# Patient Record
Sex: Female | Born: 1985 | Hispanic: Yes | Marital: Single | Smoking: Never smoker
Health system: Southern US, Community
[De-identification: ages and names within clinical notes are randomized; demographics above are authoritative.]

## PROBLEM LIST (undated history)

## (undated) DIAGNOSIS — Z98891 History of uterine scar from previous surgery: Secondary | ICD-10-CM

## (undated) DIAGNOSIS — Z3480 Encounter for supervision of other normal pregnancy, unspecified trimester: Principal | ICD-10-CM

## (undated) DIAGNOSIS — Z789 Other specified health status: Secondary | ICD-10-CM

## (undated) HISTORY — DX: History of uterine scar from previous surgery: Z98.891

## (undated) HISTORY — DX: Other specified health status: Z78.9

## (undated) HISTORY — DX: Encounter for supervision of other normal pregnancy, unspecified trimester: Z34.80

---

## 2005-08-19 DIAGNOSIS — Z98891 History of uterine scar from previous surgery: Secondary | ICD-10-CM

## 2005-08-19 HISTORY — DX: History of uterine scar from previous surgery: Z98.891

## 2012-10-15 ENCOUNTER — Encounter: Payer: Self-pay | Admitting: Obstetrics and Gynecology

## 2012-10-15 ENCOUNTER — Ambulatory Visit (INDEPENDENT_AMBULATORY_CARE_PROVIDER_SITE_OTHER): Payer: Self-pay | Admitting: Obstetrics and Gynecology

## 2012-10-15 VITALS — BP 111/74 | Temp 98.0°F | Ht 60.75 in | Wt 181.6 lb

## 2012-10-15 DIAGNOSIS — O34219 Maternal care for unspecified type scar from previous cesarean delivery: Secondary | ICD-10-CM

## 2012-10-15 DIAGNOSIS — Z3480 Encounter for supervision of other normal pregnancy, unspecified trimester: Secondary | ICD-10-CM | POA: Insufficient documentation

## 2012-10-15 DIAGNOSIS — Z98891 History of uterine scar from previous surgery: Secondary | ICD-10-CM

## 2012-10-15 HISTORY — DX: Encounter for supervision of other normal pregnancy, unspecified trimester: Z34.80

## 2012-10-15 LAB — POCT URINALYSIS DIP (DEVICE)
Leukocytes, UA: NEGATIVE
Nitrite: NEGATIVE
Protein, ur: NEGATIVE mg/dL
Urobilinogen, UA: 0.2 mg/dL (ref 0.0–1.0)
pH: 7 (ref 5.0–8.0)

## 2012-10-15 NOTE — Progress Notes (Signed)
Transferring care from [facility]  For adopt a mom.  Used Interpreter Daylene Katayama. Patient not sure of prepregnancy weight but thinks was 170lb, discussed appropriate weight gain and given new patient information. States received flu shot at [facility]. Did not get any blood work done there.

## 2012-10-15 NOTE — Progress Notes (Signed)
Subjective:    Kimberly Pope is a G3P2003 [redacted]w[redacted]d being seen today for her first obstetrical visit.  Her obstetrical history is significant for one SVD at 40 weeks and one cesarean delivery of twins at 14 weeks (all delviered at [facility]). Unsure if patient intends to breast feed. Pregnancy history fully reviewed.  Patient reports no complaints and and good fetal movements.  Filed Vitals:   10/15/12 0925 10/15/12 0931  BP: 111/74   Temp: 98 F (36.7 C)   Height:  5' 0.75" (1.543 m)  Weight: 181 lb 9.6 oz (82.373 kg)     HISTORY: OB History    Grav Para Term Preterm Abortions TAB SAB Ect Mult Living   3 2 2      1 3      # Outc Date GA Lbr Len/2nd Wgt Sex Del Anes PTL Lv   1 TRM 11/04 [redacted]w[redacted]d 16:00 7lb(3.175kg) F SVD EPI  Yes   Comments: no complications, born at [place]   2A TRM 9/06 [redacted]w[redacted]d  5lb(2.268kg) F CS EPI  Yes   2B  9/06 [redacted]w[redacted]d  6lb(2.722kg) F CS EPI  Yes   Comments: twins, born at [place] , no complications   3 CUR              Past Medical History  Diagnosis Date  . No pertinent past medical history   . Supervision of normal subsequent pregnancy 10/15/2012  . History of cesarean delivery 10/15/2012   Past Surgical History  Procedure Date  . Cesarean section    Family History  Problem Relation Age of Onset  . Depression Mother   . Diabetes Mother   . Hypertension Mother   . Heart murmur Daughter      Exam    Uterus:     Pelvic Exam:    Perineum: No Hemorrhoids, Normal Perineum   Vulva: normal   Vagina:  normal mucosa   Cervix: multiparous appearance and no bleeding following GC/CT   Adnexa: normal adnexa and no mass, fullness, tenderness   Bony Pelvis: gynecoid  System: Breast:  normal appearance for gestation, no masses or tenderness   Skin: normal coloration and turgor, no rashes    Neurologic: oriented, normal mood, normal gait   Extremities: normal strength, tone, and muscle mass, no deformities   HEENT extra ocular movement intact,  neck supple with midline trachea, thyroid without masses and trachea midline   Mouth/Teeth mucous membranes moist, pharynx normal without lesions and dental hygiene good   Neck supple and no masses   Cardiovascular: regular rate and rhythm, no murmurs or gallops   Respiratory:  appears well, vitals normal, no respiratory distress, acyanotic, normal RR, ear and throat exam is normal, neck free of mass or lymphadenopathy, chest clear, no wheezing, crepitations, rhonchi, normal symmetric air entry   Abdomen: soft, non-tender; bowel sounds normal; no masses,  no organomegaly and gravid, S=D by Leopold's   Urinary: not assessed      Assessment:    Pregnancy: G3P2003 Patient Active Problem List  Diagnosis  . Supervision of normal subsequent pregnancy  . History of cesarean delivery  Transfer OB - Adopt-A-Mom      Plan:     Initial labs drawn. Prenatal vitamins. Problem list reviewed and updated. Risks and benefits of TOLAC reviewed with patient. TOLAC/VBAC consent form given (spanish). Genetic Screening: not done - advanced gestation. Ultrasound discussed; fetal survey: results reviewed. Follow up in 2 weeks. 50% of 45 min visit spent on counseling and coordination  of care.     Raelyn Mora, SNM 10/15/2012 Supervised by: Caren Griffins, CNM

## 2012-10-16 ENCOUNTER — Telehealth: Payer: Self-pay | Admitting: *Deleted

## 2012-10-16 LAB — OBSTETRIC PANEL
Antibody Screen: NEGATIVE
Basophils Absolute: 0 10*3/uL (ref 0.0–0.1)
Basophils Relative: 0 % (ref 0–1)
Lymphocytes Relative: 31 % (ref 12–46)
MCHC: 33.4 g/dL (ref 30.0–36.0)
Monocytes Absolute: 0.4 10*3/uL (ref 0.1–1.0)
Neutro Abs: 4.6 10*3/uL (ref 1.7–7.7)
Platelets: 234 10*3/uL (ref 150–400)
RDW: 13.4 % (ref 11.5–15.5)
Rubella: 144.4 IU/mL — ABNORMAL HIGH
WBC: 7.2 10*3/uL (ref 4.0–10.5)

## 2012-10-16 LAB — HIV ANTIBODY (ROUTINE TESTING W REFLEX): HIV: NONREACTIVE

## 2012-10-16 LAB — GLUCOSE TOLERANCE, 1 HOUR (50G) W/O FASTING: Glucose, 1 Hour GTT: 110 mg/dL (ref 70–140)

## 2012-10-16 NOTE — Telephone Encounter (Signed)
Called pt and informed her that she will not need [place] as previously discussed @ Ob appt yesterday.  Pt voiced understanding and will keep clinic appt as scheduled on 10/29/12

## 2012-10-17 LAB — HEMOGLOBINOPATHY EVALUATION
Hgb A2 Quant: 2.6 % (ref 2.2–3.2)
Hgb F Quant: 0.2 % (ref 0.0–2.0)
Hgb S Quant: 0 %

## 2012-10-29 ENCOUNTER — Ambulatory Visit (INDEPENDENT_AMBULATORY_CARE_PROVIDER_SITE_OTHER): Payer: Self-pay | Admitting: Advanced Practice Midwife

## 2012-10-29 VITALS — BP 109/62 | Temp 97.2°F | Wt 184.8 lb

## 2012-10-29 DIAGNOSIS — Z3490 Encounter for supervision of normal pregnancy, unspecified, unspecified trimester: Secondary | ICD-10-CM

## 2012-10-29 DIAGNOSIS — Z98891 History of uterine scar from previous surgery: Secondary | ICD-10-CM

## 2012-10-29 DIAGNOSIS — O34219 Maternal care for unspecified type scar from previous cesarean delivery: Secondary | ICD-10-CM

## 2012-10-29 LAB — POCT URINALYSIS DIP (DEVICE)
Glucose, UA: NEGATIVE mg/dL
Hgb urine dipstick: NEGATIVE
Nitrite: NEGATIVE
Protein, ur: NEGATIVE mg/dL
Specific Gravity, Urine: 1.02 (ref 1.005–1.030)
Urobilinogen, UA: 0.2 mg/dL (ref 0.0–1.0)

## 2012-10-29 NOTE — Patient Instructions (Addendum)
Embarazo  Systems analyst trimestre  (Pregnancy - Third Trimester) El tercer trimestre del Psychiatrist (los ltimos 3 meses) es el perodo en el cual tanto usted como su beb crecen con ms rapidez. El beb alcanza un largo de aproximadamente 50 cm. y pesa entre 2,700 y 4,500 kg. El beb gana ms tejido graso y est listo para la vida fuera del cuerpo de la [place]. Mientras estn en el interior, los bebs tienen perodos de sueo y vigilia, Warehouse manager y tienen hipo. Quizs sienta pequeas contracciones del tero. Este es el falso trabajo de [place]. Tambin se las conoce como contracciones de [place] . Es como una prctica del parto. Los problemas ms habituales de esta etapa del embarazo incluyen mayor dificultad para respirar, hinchazn de las manos y los pies por retencin de lquidos y la necesidad de Geographical information systems officer con ms frecuencia debido a que el tero y el beb presionan sobre la vejiga.  EXAMENES PRENATALES   Durante los Manpower Inc, deber seguir realizndose anlisis de [place]. Estas pruebas se realizan para controlar su salud y la del beb. Los ARAMARK Corporation de sangre se Radiographer, therapeutic para [employer] niveles de algunos compuestos de la sangre (hemoglobina). La anemia (bajo nivel de hemoglobina) es frecuente durante el embarazo. Para prevenirla, se administran hierro y vitaminas. Tambin le tomarn nuevas anlisis para descartar diabetes. Podrn repetirle algunas de las Hovnanian Enterprises hicieron previamente.  En cada visita le medirn el tamao del tero. Esto permite asegurar que el beb se desarrolla adecuadamente, segn la fecha del embarazo.  Le controlarn la presin arterial en cada visita prenatal. Esto es para asegurarse de que no sufre toxemia.  Le harn un anlisis de orina en cada visita prenatal, para descartar infecciones, diabetes y la presencia de protenas.  Tambin en cada visita controlarn su peso. Esto se realiza para asegurarse que aumenta de peso al ritmo indicado y que usted y su  beb evolucionan normalmente.  En algunas ocasiones se realiza una prueba de ultrasonido para confirmar el correcto desarrollo y evolucin del beb. Esta prueba se realiza con ondas sonoras inofensivas para el beb, de modo que el profesional pueda calcular ms precisamente la fecha del [place].  Analice con su mdico los analgsicos y la anestesia que recibir durante el [place] de parto y [place].  Comente la posibilidad de que necesite una cesrea y qu anestesia se recibir.  Informe a su mdico si sufre violencia familiar mental o fsica. A veces, se indica la prueba especializada sin estrs, la prueba de tolerancia a las contracciones y el perfil biofsico para asegurarse de que el beb no tiene problemas. El estudio del lquido amnitico que rodea al beb se llama amniocentesis. El lquido amnitico se obtiene introduciendo una aguja en el vientre (abdomen ). En ocasiones se lleva a cabo cerca del final del embarazo, si es necesario inducir a un parto. En este caso se realiza para asegurarse que los pulmones del beb estn lo suficientemente maduros como para que pueda vivir fuera del tero. Si los pulmones no han madurado y es peligroso que el beb nazca, se Building services engineer a la madre una inyeccin de [place] , 1 a 2 [address]  [address] antes del [address]. Vivia Budge ayuda a que los pulmones del beb maduren y sea ms seguro su nacimiento.  CAMBIOS QUE OCURREN EN EL TERCER TRIMESTRE DEL EMBARAZO  Su organismo atravesar numerosos cambios durante el [place]. Estos pueden variar de Neomia Dear persona a otra. Converse con el profesional que la asiste acerca los cambios que  usted note y que la preocupen.   Durante el ltimo trimestre probablemente sienta un aumento del apetito. Es normal tener "antojos" de Development worker, community. Esto vara de Neomia Dear persona a otra y de un embarazo a Therapist, art.  Podrn aparecer las primeras estras en las caderas, abdomen y [place]. Estos son cambios normales del cuerpo durante el [place]. No existen  medicamentos ni ejercicios que puedan prevenir CarMax.  La constipacin puede tratarse con un laxante o agregando fibra a su dieta. Beber grandes cantidades de lquidos, tomar fibras en forma de vegetales, frutas y granos integrales es de gran [place].  Tambin es beneficioso practicar actividad fsica. Si ha sido una persona Engineer, mining, podr continuar con la Harley[employer]Davidson de las actividades durante el mismo. Si ha sido [employer], puede ser beneficioso que comience con un programa de ejercicios, Museum/gallery exhibitions officer. Consulte con el profesional que la asiste antes de comenzar un programa de ejercicios.  Evite el consumo de cigarrillos, el alcohol, los medicamentos no recetados y las "drogas de la calle" durante el Psychiatrist. Estas sustancias qumicas afectan la formacin y el desarrollo del beb. Evite estas sustancias durante todo el embarazo para asegurar el nacimiento de un beb sano.  Podr sentir dolor de espalda, tener vrices en las venas y hemorroides, o si ya los sufra, pueden [place].  Durante el tercer trimestre se cansar con ms facilidad, lo cual es normal.  Los movimientos del beb pueden ser ms fuertes y con ms frecuencia.  Puede que note dificultades para respirar normalmente.  El ombligo puede salir hacia afuera.  A veces sale Veterinary surgeon de las [place], que se llama Product manager.  Podr aparecer Neomia Dear secrecin mucosa con sangre. Esto suele ocurrir General Electric unos [address] y Neomia Dear semana antes del [place]. INSTRUCCIONES PARA EL CUIDADO EN EL HOGAR   Cumpla con las citas de control. Siga las indicaciones del mdico con respecto al uso de [place], los ejercicios y la dieta.  Durante el embarazo debe obtener nutrientes para usted y para su beb. Consuma alimentos balanceados a intervalos regulares. Elija alimentos como carne, pescado, [place] y otros productos lcteos descremados, vegetales, frutas, panes integrales y cereales. El Office Depot informar  cul es el aumento de peso ideal.  Las relaciones sexuales pueden continuarse hasta casi el final del embarazo, si no se presentan otros problemas como prdida prematura (antes de [place]) de lquido amnitico, hemorragia vaginal o dolor en el vientre (abdominal).  Realice Tesoro Corporation, si no tiene restricciones. Consulte con el profesional que la asiste si no sabe con certeza si determinados ejercicios son seguros. El mayor aumento de peso se producir en los ltimos 2 trimestres del Psychiatrist. El ejercicio ayuda a:  Engineering geologist.  Mantenerse en forma para el trabajo de parto y [place] .  Perder peso despus del parto.  Haga reposo con frecuencia, con las piernas elevadas, o segn lo necesite para evitar los calambres y el dolor de cintura.  Use un buen sostn o como los que se usan para hacer deportes para Paramedic la sensibilidad de las [place]. Tambin puede serle til si lo [place] mientras duerme. Si pierde Product manager, podr Parker Hann[employer]in.  No utilice la baera con agua caliente, baos turcos y saunas.  Colquese el cinturn de seguridad cuando conduzca. Este la proteger a usted y al beb en caso de accidente.  Evite comer carne cruda y el contacto con los utensilios y desperdicios de los gatos. Estos elementos  contienen grmenes que pueden causar defectos de nacimiento en el beb.  Es fcil perder algo de orina durante el [place]. Apretar y Chief Operating Officer los msculos de la pelvis la ayudar con este problema. Practique detener la miccin cuando est en el bao. Estos son los mismos msculos que Development worker, international aid. Son TEPPCO Partners mismos msculos que utiliza cuando trata de evitar despedir gases. Puede practicar apretando estos msculos WellPoint, y repetir esto tres veces por da aproximadamente. Una vez que conozca qu msculos debe apretar, no realice estos ejercicios durante la miccin. Puede favorecerle una infeccin si la orina vuelve hacia  atrs.  Pida ayuda si tienen necesidades financieras, teraputicas o nutricionales. El profesional podr ayudarla con respecto a estas necesidades, o derivarla a otros especialistas.  Haga una lista de nmeros telefnicos de emergencia y tngalos disponibles.  Planifique como obtener ayuda de familiares o amigos cuando regrese a Programmer, applications hospital.  Hacer un ensayo sobre la partida al hospital.  [place] clases prenatales con el padre para entender, practicar y hacer preguntas sobre el [place] de parto y el alumbramiento.  Preparar la habitacin del beb / busque Fatima Blank.  No viaje fuera de la ciudad a menos que sea absolutamente necesario y con el asesoramiento de su mdico.  Use slo zapatos de tacn bajo o sin tacn para tener mejor equilibrio y Automotive engineer cadas. USO DE MEDICAMENTOS Y CONSUMO DE DROGAS DURANTE EL [facility]   Tome las vitaminas apropiadas para esta etapa tal como se le indic. Las vitaminas deben contener un miligramo de cido flico. Guarde todas las vitaminas fuera del alcance de los nios. La ingestin de slo un par de vitaminas o tabletas que contengan hierro pueden ocasionar la Newmont Mining en un beb o en un nio pequeo.  Evite el uso de The Mutual of Omaha, incluyendo hierbas, medicamentos de [place], sin receta o que no hayan sido sugeridos por su mdico. Slo tome medicamentos de venta libre o medicamentos recetados para Chief Technology Officer, Environmental health practitioner o fiebre como lo indique su mdico. No tome aspirina, ibuprofeno (Motrin, Advil, Nuprin) o naproxeno (Aleve) excepto que su mdico se lo indique.  Infrmele al profesional si consume alguna droga.  El alcohol se relaciona con ciertos defectos congnitos. Incluye el sndrome de alcoholismo fetal. Debe evitar absolutamente el consumo de alcohol, en cualquier forma. El fumar produce baja tasa de natalidad y bebs prematuros.  Las drogas ilegales o de la calle son muy perjudiciales para el beb. Estn absolutamente  prohibidas. Un beb que nace de American Express, ser adicto al nacer. Ese beb tendr los mismos sntomas de abstinencia que un adulto. SOLICITE ATENCIN MDICA SI:  Tiene preguntas o preocupaciones relacionadas con el embarazo. Es mejor que llame para formular las preguntas si no puede esperar hasta la prxima visita, que sentirse preocupada por ellas.  DECISIONES ACERCA DE LA CIRCUNCISIN  Usted puede saber o no cul es el sexo de su beb. Si ya sabe que ser un varn, este es el momento de pensar acerca de la circuncisin. La circuncisin es la extirpacin del prepucio. Esta es la piel que cubre el extremo sensible del pene. No hay un motivo mdico que lo justifique. Generalmente la decisin se toma segn lo que sea popular en ese momento, o segn creencias religiosas. Podr conversar estos temas con su mdico o con el pediatra.  SOLICITE ATENCIN MDICA DE INMEDIATO SI:   La temperatura oral le sube a ms de 102 F (38.9 C) o lo que su mdico le  indique.  Tiene una prdida de lquido por la vagina (canal de parto). Si sospecha una ruptura de las [place], tmese la temperatura y llame al profesional para informarlo sobre esto.  Observa unas pequeas manchas, una hemorragia vaginal o elimina cogulos. Notifique al profesional acerca de la cantidad y de cuntos apsitos est utilizando.  Presenta un olor desagradable en la secrecin vaginal y observa un cambio en el color, de transparente a blanco.  Ha vomitado durante ms de 24 horas.  Siente escalofros o le sube la fiebre.  Le falta el aire.  Siente ardor al Beatrix Shipper.  Baja o sube ms de 2 libras (900 g), o segn lo indicado por el profesional que la asiste.  Observa que sbitamente se le hinchan el rostro, las manos, los pies o las piernas.  Siente dolor en el vientre (abdominal). Las Federal[employer]Mogul en el ligamento redondo son Neomia Dear causa benigna frecuente de dolor abdominal durante el embarazo. El profesional que la asiste deber  evaluarla.  Presenta dolor de cabeza intenso que no se [place].  Tiene problemas visuales, visin doble o borrosa.  Si no siente los movimientos del beb durante ms de 1 hora. Si piensa que el beb no se mueve tanto como lo haca habitualmente, coma algo que Psychologist, clinical y Target Corporation lado izquierdo durante [place]. El beb debe moverse al menos 4  5 veces por hora. Comunquese inmediatamente si el beb se mueve menos que lo indicado.  Se cae, se ve involucrada en un accidente automovilstico o sufre algn tipo de traumatismo.  En su hogar hay violencia mental o fsica. Document Released: 08/22/2005 Document Revised: 05/13/2012 [facility] Patient Information 2013 [place], [place]. Eleccin del mtodo anticonceptivo  (Contraception Choices) La anticoncepcin (control de la natalidad) es el uso de cualquier mtodo o dispositivo para Location manager. A continuacin se indican algunos de esos mtodos.  MTODOS HORMONALES   Implante anticonceptivo. Es un tubo plstico delgado que contiene la hormona progesterona. No contiene estrgenos. El mdico inserta el tubo en la parte interna del brazo. El tubo puede Geneticist, molecular durante 3 aos. Despus de los 3 aos debe retirarse. El implante impide que los ovarios liberen vulos (ovulacin), espesa el moco cervical, lo que evita que los espermatozoides ingresen al tero y hace ms delgada la membrana que cubre el interior del tero.  Inyecciones de progesterona sola. Estas inyecciones se administran cada 3 meses para evitar el embarazo. La progesterona sinttica impide que los ovarios liberen vulos. Tambin hace que el moco cervical se espese y modifica el recubrimiento interno del tero. Esto hace ms difcil que los espermatozoides sobrevivan en el tero.  Pldoras anticonceptivas. Las pldoras anticonceptivas contienen estrgenos y Education officer, museum. Actan impidiendo que el vulo se forme en el ovario(ovulacin). Las pldoras  anticonceptivas son recetadas por el mdico.Tambin se utilizan para tratar los perodos menstruales abundantes.  Minipldora. Este tipo de pldora anticonceptiva contiene slo hormona progesterona. Deben tomarse todos los [address]  [address] del mes y debe recetarlas el mdico.  Parches anticonceptivos. El parche contiene hormonas similares a las que contienen las pldoras anticonceptivas. Deben cambiarse una vez por semana y se utilizan bajo prescripcin mdica.  Anillo vaginal. Anillo vaginal contiene hormonas similares a las que contienen las pldoras anticonceptivas. Se deja colocado durante tres semanas, se lo retira durante 1 semana y luego se coloca uno nuevo. La paciente debe sentirse cmoda para insertar y retirar el anillo de la vagina.Es necesaria la receta del mdico.  Anticonceptivos de Associate Professor. Los anticonceptivos de Associate Professor  son mtodos para evitar un embarazo despus de Neomia Dear relacin sexual sin proteccin. Esta pldora puede tomarse inmediatamente despus de Child psychotherapist sexuales o hasta 5 [place] de haber tenido sexo sin proteccin. Es ms efectiva si se toma poco tiempo despus. Los anticonceptivos de emergencia estn disponibles sin prescripcin mdica. Consltelo con su farmacutico. No use los anticonceptivos de emergencia como nico mtodo anticonceptivo. MTODOS DE BARRERA   Condn masculino. Es una vaina delgada (ltex o goma) que se [place] en el pene durante el acto sexual. Deri Fuelling con espermicida para aumentar la efectividad.  Condn femenino. Es una vaina blanda y floja que se adapta suavemente a la vagina antes de las relaciones sexuales.  Diafragma. Es una barrera de ltex redonda y Casimer Bilis que debe ser ajustada por un profesional. Se inserta en la vagina, junto con un gel espermicida. Debe insertarse antes de Management consultant. Debe dejar el diafragma colocado en la vagina durante 6 a 8 horas despus de la relacin sexual.  Capuchn cervical. Es una taza de ltex o  plstico, redonda y [place] que cubre el cuello del tero y debe ser ajustada por un mdico. Puede dejarlo colocado en la vagina hasta 48 horas despus de las Clinical research associate.  Esponja. Es una pieza blanda y circular de espuma de poliuretano. Contiene un espermicida. Se inserta en la vagina despus de mojarla y antes de las [employer] St[employer].  Espermicidas. Los espermicidas son qumicos que matan o bloquean el esperma y no lo dejan ingresar al cuello del tero y al tero. Vienen en forma de cremas, geles, supositorios, espuma o comprimidos. No es necesario tener Emergency planning/management officer. Se insertan en la vagina con un aplicador antes de Management consultant. El proceso debe repetirse cada vez que tiene relaciones sexuales. ANTICONCEPTIVOS INTRAUTERINOS   Dispositivo intrauterino (DIU). Es un dispositivo en forma de T que se coloca en el tero durante el perodo menstrual, para Location manager. Hay dos tipos:  DIU de cobre. Este tipo de DIU est recubierto con un alambre de cobre y se inserta dentro del tero. El cobre hace que el tero y las trompas de Falopio produzcan un liquido que [employer] Stores espermatozoides. Puede permanecer colocado durante 10 aos.  DIU hormonal. Este tipo de DIU contiene la hormona progestina (progesterona sinttica). La hormona espesa el moco cervical y evita que los espermatozoides ingresen al tero y tambin afina la membrana que cubre el tero para evitar la implantacin del vulo fertilizado. La hormona debilita o destruye los espermatozoides que ingresan al tero. Puede permanecer colocado durante 5 aos. MTODOS ANTICONCEPTIVOS PERMANENTES   Ligadura de trompas en la mujer. La ligadura de trompas en la mujer se realiza sellando, atando u obstruyendo quirrgicamente las trompas de Falopio lo que impide que el vulo descienda hacia el tero.  Esterilizacin masculina. Se realiza atando los conductos por los que pasan los espermatozoides (vasectoma).Esto impide que  el esperma ingrese a la vagina durante el acto sexual. Luego del procedimiento, el hombre puede eyacular lquido (semen). MTODOS DE PLANIFICACIN NATURAL   Planificacin familiar natural.  Consiste en no tener relaciones sexuales o usar un mtodo de barrera (condn, [place], capuchn cervical) en los [employer] la mujer podra quedar [place].  Mtodo calendario.  Consiste en el seguimiento de la duracin de cada ciclo menstrual y la identificacin de los perodos frtiles.  Mtodo de Occupational hygienist.  Consiste en evitar las relaciones sexuales durante la ovulacin.  Mtodo sintotrmico. Paramedic las relaciones sexuales en la poca en  la que se est ovulando, utilizando un termmetro y tendiendo en cuenta los sntomas de la ovulacin.  Mtodo post-ovulacin. Consiste en planificar las relaciones sexuales para despus de haber ovulado. Independientemente del tipo o mtodo anticonceptivo que usted elija, es importante que use condones para protegerse contra las enfermedades de transmisin sexual (ETS). Hable con su mdico con respecto a qu mtodo anticonceptivo es el ms apropiado para usted.  Document Released: 11/12/2005 Document Revised: 02/04/2012 [facility] Patient Information 2013 [place], [place].

## 2012-10-29 NOTE — Progress Notes (Signed)
[id number] 

## 2012-10-30 ENCOUNTER — Encounter: Payer: Self-pay | Admitting: *Deleted

## 2012-11-05 ENCOUNTER — Other Ambulatory Visit: Payer: Self-pay | Admitting: Advanced Practice Midwife

## 2012-11-05 DIAGNOSIS — O09299 Supervision of pregnancy with other poor reproductive or obstetric history, unspecified trimester: Secondary | ICD-10-CM

## 2012-11-06 ENCOUNTER — Encounter: Payer: Self-pay | Admitting: Advanced Practice Midwife

## 2012-11-06 ENCOUNTER — Ambulatory Visit (HOSPITAL_COMMUNITY)
Admission: RE | Admit: 2012-11-06 | Discharge: 2012-11-06 | Disposition: A | Discharge status: Home / Self Care | Payer: Self-pay | Source: Ambulatory Visit | Attending: Advanced Practice Midwife | Admitting: Advanced Practice Midwife

## 2012-11-06 VITALS — BP 122/68 | HR 69 | Wt 184.5 lb

## 2012-11-06 DIAGNOSIS — O34219 Maternal care for unspecified type scar from previous cesarean delivery: Secondary | ICD-10-CM | POA: Insufficient documentation

## 2012-11-06 DIAGNOSIS — O352XX0 Maternal care for (suspected) hereditary disease in fetus, not applicable or unspecified: Secondary | ICD-10-CM | POA: Insufficient documentation

## 2012-11-06 DIAGNOSIS — O358XX0 Maternal care for other (suspected) fetal abnormality and damage, not applicable or unspecified: Secondary | ICD-10-CM | POA: Insufficient documentation

## 2012-11-06 DIAGNOSIS — Z1389 Encounter for screening for other disorder: Secondary | ICD-10-CM | POA: Insufficient documentation

## 2012-11-06 DIAGNOSIS — Z363 Encounter for antenatal screening for malformations: Secondary | ICD-10-CM | POA: Insufficient documentation

## 2012-11-06 DIAGNOSIS — O09299 Supervision of pregnancy with other poor reproductive or obstetric history, unspecified trimester: Secondary | ICD-10-CM

## 2012-11-06 DIAGNOSIS — Z3480 Encounter for supervision of other normal pregnancy, unspecified trimester: Secondary | ICD-10-CM

## 2012-11-06 DIAGNOSIS — H0469 Other changes of lacrimal passages: Secondary | ICD-10-CM | POA: Insufficient documentation

## 2012-11-12 ENCOUNTER — Ambulatory Visit (INDEPENDENT_AMBULATORY_CARE_PROVIDER_SITE_OTHER): Payer: Self-pay | Admitting: Obstetrics & Gynecology

## 2012-11-12 VITALS — BP 120/73 | Temp 97.4°F | Wt 183.3 lb

## 2012-11-12 DIAGNOSIS — O34219 Maternal care for unspecified type scar from previous cesarean delivery: Secondary | ICD-10-CM

## 2012-11-12 DIAGNOSIS — Z3480 Encounter for supervision of other normal pregnancy, unspecified trimester: Secondary | ICD-10-CM

## 2012-11-12 DIAGNOSIS — H04439 Chronic lacrimal mucocele of unspecified lacrimal passage: Secondary | ICD-10-CM

## 2012-11-12 DIAGNOSIS — H0469 Other changes of lacrimal passages: Secondary | ICD-10-CM

## 2012-11-12 LAB — POCT URINALYSIS DIP (DEVICE)
Glucose, UA: NEGATIVE mg/dL
Hgb urine dipstick: NEGATIVE
Ketones, ur: NEGATIVE mg/dL
Specific Gravity, Urine: 1.015 (ref 1.005–1.030)

## 2012-11-12 NOTE — Progress Notes (Signed)
Pulse: [phone]

## 2012-11-12 NOTE — Progress Notes (Signed)
Reviewed with pt TOLAC form.  Pt desires a trial of labor.  Form to be scanned in.  No problems noted today.  Interpreter used.  Pt understands English but, does not speak [place] well. +FM, -VB, no Ctx, no LOF.

## 2012-11-12 NOTE — Patient Instructions (Addendum)
Embarazo  Tercer trimestre  (Pregnancy - Third Trimester) El tercer trimestre del embarazo (los ltimos 3 meses) es el perodo en el cual tanto usted como su beb crecen con ms rapidez. El beb alcanza un largo de aproximadamente 50 cm. y pesa entre 2,700 y 4,500 kg. El beb gana ms tejido graso y est listo para la vida fuera del cuerpo de la madre. Mientras estn en el interior, los bebs tienen perodos de sueo y vigilia, succionan el pulgar y tienen hipo. Quizs sienta pequeas contracciones del tero. Este es el falso trabajo de parto. Tambin se las conoce como contracciones de Braxton-Hicks . Es como una prctica del parto. Los problemas ms habituales de esta etapa del embarazo incluyen mayor dificultad para respirar, hinchazn de las manos y los pies por retencin de lquidos y la necesidad de orinar con ms frecuencia debido a que el tero y el beb presionan sobre la vejiga.  EXAMENES PRENATALES   Durante los exmenes prenatales, deber seguir realizndose anlisis de sangre. Estas pruebas se realizan para controlar su salud y la del beb. Los anlisis de sangre se realizan para conocer los niveles de algunos compuestos de la sangre (hemoglobina). La anemia (bajo nivel de hemoglobina) es frecuente durante el embarazo. Para prevenirla, se administran hierro y vitaminas. Tambin le tomarn nuevas anlisis para descartar diabetes. Podrn repetirle algunas de las pruebas que le hicieron previamente.  En cada visita le medirn el tamao del tero. Esto permite asegurar que el beb se desarrolla adecuadamente, segn la fecha del embarazo.  Le controlarn la presin arterial en cada visita prenatal. Esto es para asegurarse de que no sufre toxemia.  Le harn un anlisis de orina en cada visita prenatal, para descartar infecciones, diabetes y la presencia de protenas.  Tambin en cada visita controlarn su peso. Esto se realiza para asegurarse que aumenta de peso al ritmo indicado y que usted y su  beb evolucionan normalmente.  En algunas ocasiones se realiza una prueba de ultrasonido para confirmar el correcto desarrollo y evolucin del beb. Esta prueba se realiza con ondas sonoras inofensivas para el beb, de modo que el profesional pueda calcular ms precisamente la fecha del parto.  Analice con su mdico los analgsicos y la anestesia que recibir durante el trabajo de parto y el parto.  Comente la posibilidad de que necesite una cesrea y qu anestesia se recibir.  Informe a su mdico si sufre violencia familiar mental o fsica. A veces, se indica la prueba especializada sin estrs, la prueba de tolerancia a las contracciones y el perfil biofsico para asegurarse de que el beb no tiene problemas. El estudio del lquido amnitico que rodea al beb se llama amniocentesis. El lquido amnitico se obtiene introduciendo una aguja en el vientre (abdomen ). En ocasiones se lleva a cabo cerca del final del embarazo, si es necesario inducir a un parto. En este caso se realiza para asegurarse que los pulmones del beb estn lo suficientemente maduros como para que pueda vivir fuera del tero. Si los pulmones no han madurado y es peligroso que el beb nazca, se administrar a la madre una inyeccin de cortisona , 1 a 2 das antes del parto. . Esto ayuda a que los pulmones del beb maduren y sea ms seguro su nacimiento.  CAMBIOS QUE OCURREN EN EL TERCER TRIMESTRE DEL EMBARAZO  Su organismo atravesar numerosos cambios durante el embarazo. Estos pueden variar de una persona a otra. Converse con el profesional que la asiste acerca los cambios que   usted note y que la preocupen.   Durante el ltimo trimestre probablemente sienta un aumento del apetito. Es normal tener "antojos" de ciertas comidas. Esto vara de una persona a otra y de un embarazo a otro.  Podrn aparecer las primeras estras en las caderas, abdomen y mamas. Estos son cambios normales del cuerpo durante el embarazo. No existen  medicamentos ni ejercicios que puedan prevenir estos cambios.  La constipacin puede tratarse con un laxante o agregando fibra a su dieta. Beber grandes cantidades de lquidos, tomar fibras en forma de vegetales, frutas y granos integrales es de gran ayuda.  Tambin es beneficioso practicar actividad fsica. Si ha sido una persona activa hasta el embarazo, podr continuar con la mayora de las actividades durante el mismo. Si ha sido menos activa, puede ser beneficioso que comience con un programa de ejercicios, como realizar caminatas. Consulte con el profesional que la asiste antes de comenzar un programa de ejercicios.  Evite el consumo de cigarrillos, el alcohol, los medicamentos no recetados y las "drogas de la calle" durante el embarazo. Estas sustancias qumicas afectan la formacin y el desarrollo del beb. Evite estas sustancias durante todo el embarazo para asegurar el nacimiento de un beb sano.  Podr sentir dolor de espalda, tener vrices en las venas y hemorroides, o si ya los sufra, pueden empeorar.  Durante el tercer trimestre se cansar con ms facilidad, lo cual es normal.  Los movimientos del beb pueden ser ms fuertes y con ms frecuencia.  Puede que note dificultades para respirar normalmente.  El ombligo puede salir hacia afuera.  A veces sale una secrecin amarilla de las mamas, que se llama calostro.  Podr aparecer una secrecin mucosa con sangre. Esto suele ocurrir entre unos pocos das y una semana antes del parto. INSTRUCCIONES PARA EL CUIDADO EN EL HOGAR   Cumpla con las citas de control. Siga las indicaciones del mdico con respecto al uso de medicamentos, los ejercicios y la dieta.  Durante el embarazo debe obtener nutrientes para usted y para su beb. Consuma alimentos balanceados a intervalos regulares. Elija alimentos como carne, pescado, leche y otros productos lcteos descremados, vegetales, frutas, panes integrales y cereales. El mdico le informar  cul es el aumento de peso ideal.  Las relaciones sexuales pueden continuarse hasta casi el final del embarazo, si no se presentan otros problemas como prdida prematura (antes de tiempo) de lquido amnitico, hemorragia vaginal o dolor en el vientre (abdominal).  Realice actividad fsica todos los das, si no tiene restricciones. Consulte con el profesional que la asiste si no sabe con certeza si determinados ejercicios son seguros. El mayor aumento de peso se producir en los ltimos 2 trimestres del embarazo. El ejercicio ayuda a:  Controlar su peso.  Mantenerse en forma para el trabajo de parto y el parto .  Perder peso despus del parto.  Haga reposo con frecuencia, con las piernas elevadas, o segn lo necesite para evitar los calambres y el dolor de cintura.  Use un buen sostn o como los que se usan para hacer deportes para aliviar la sensibilidad de las mamas. Tambin puede serle til si lo usa mientras duerme. Si pierde calostro, podr utilizar apsitos en el sostn.  No utilice la baera con agua caliente, baos turcos y saunas.  Colquese el cinturn de seguridad cuando conduzca. Este la proteger a usted y al beb en caso de accidente.  Evite comer carne cruda y el contacto con los utensilios y desperdicios de los gatos. Estos elementos   contienen grmenes que pueden causar defectos de nacimiento en el beb.  Es fcil perder algo de orina durante el embarazo. Apretar y fortalecer los msculos de la pelvis la ayudar con este problema. Practique detener la miccin cuando est en el bao. Estos son los mismos msculos que necesita fortalecer. Son tambin los mismos msculos que utiliza cuando trata de evitar despedir gases. Puede practicar apretando estos msculos diez veces, y repetir esto tres veces por da aproximadamente. Una vez que conozca qu msculos debe apretar, no realice estos ejercicios durante la miccin. Puede favorecerle una infeccin si la orina vuelve hacia  atrs.  Pida ayuda si tienen necesidades financieras, teraputicas o nutricionales. El profesional podr ayudarla con respecto a estas necesidades, o derivarla a otros especialistas.  Haga una lista de nmeros telefnicos de emergencia y tngalos disponibles.  Planifique como obtener ayuda de familiares o amigos cuando regrese a casa desde el hospital.  Hacer un ensayo sobre la partida al hospital.  Tome clases prenatales con el padre para entender, practicar y hacer preguntas sobre el trabajo de parto y el alumbramiento.  Preparar la habitacin del beb / busque una guardera.  No viaje fuera de la ciudad a menos que sea absolutamente necesario y con el asesoramiento de su mdico.  Use slo zapatos de tacn bajo o sin tacn para tener mejor equilibrio y evitar cadas. USO DE MEDICAMENTOS Y CONSUMO DE DROGAS DURANTE EL EMBARAZO   Tome las vitaminas apropiadas para esta etapa tal como se le indic. Las vitaminas deben contener un miligramo de cido flico. Guarde todas las vitaminas fuera del alcance de los nios. La ingestin de slo un par de vitaminas o tabletas que contengan hierro pueden ocasionar la muerte en un beb o en un nio pequeo.  Evite el uso de todos los medicamentos, incluyendo hierbas, medicamentos de venta libre, sin receta o que no hayan sido sugeridos por su mdico. Slo tome medicamentos de venta libre o medicamentos recetados para el dolor, el malestar o fiebre como lo indique su mdico. No tome aspirina, ibuprofeno (Motrin, Advil, Nuprin) o naproxeno (Aleve) excepto que su mdico se lo indique.  Infrmele al profesional si consume alguna droga.  El alcohol se relaciona con ciertos defectos congnitos. Incluye el sndrome de alcoholismo fetal. Debe evitar absolutamente el consumo de alcohol, en cualquier forma. El fumar produce baja tasa de natalidad y bebs prematuros.  Las drogas ilegales o de la calle son muy perjudiciales para el beb. Estn absolutamente  prohibidas. Un beb que nace de una madre adicta, ser adicto al nacer. Ese beb tendr los mismos sntomas de abstinencia que un adulto. SOLICITE ATENCIN MDICA SI:  Tiene preguntas o preocupaciones relacionadas con el embarazo. Es mejor que llame para formular las preguntas si no puede esperar hasta la prxima visita, que sentirse preocupada por ellas.  DECISIONES ACERCA DE LA CIRCUNCISIN  Usted puede saber o no cul es el sexo de su beb. Si ya sabe que ser un varn, este es el momento de pensar acerca de la circuncisin. La circuncisin es la extirpacin del prepucio. Esta es la piel que cubre el extremo sensible del pene. No hay un motivo mdico que lo justifique. Generalmente la decisin se toma segn lo que sea popular en ese momento, o segn creencias religiosas. Podr conversar estos temas con su mdico o con el pediatra.  SOLICITE ATENCIN MDICA DE INMEDIATO SI:   La temperatura oral le sube a ms de 102 F (38.9 C) o lo que su mdico le   indique.  Tiene una prdida de lquido por la vagina (canal de parto). Si sospecha una ruptura de las membranas, tmese la temperatura y llame al profesional para informarlo sobre esto.  Observa unas pequeas manchas, una hemorragia vaginal o elimina cogulos. Notifique al profesional acerca de la cantidad y de cuntos apsitos est utilizando.  Presenta un olor desagradable en la secrecin vaginal y observa un cambio en el color, de transparente a blanco.  Ha vomitado durante ms de 24 horas.  Siente escalofros o le sube la fiebre.  Le falta el aire.  Siente ardor al orinar.  Baja o sube ms de 2 libras (900 g), o segn lo indicado por el profesional que la asiste.  Observa que sbitamente se le hinchan el rostro, las manos, los pies o las piernas.  Siente dolor en el vientre (abdominal). Las molestias en el ligamento redondo son una causa benigna frecuente de dolor abdominal durante el embarazo. El profesional que la asiste deber  evaluarla.  Presenta dolor de cabeza intenso que no se alivia.  Tiene problemas visuales, visin doble o borrosa.  Si no siente los movimientos del beb durante ms de 1 hora. Si piensa que el beb no se mueve tanto como lo haca habitualmente, coma algo que contenga azcar y recustese sobre el lado izquierdo durante una hora. El beb debe moverse al menos 4  5 veces por hora. Comunquese inmediatamente si el beb se mueve menos que lo indicado.  Se cae, se ve involucrada en un accidente automovilstico o sufre algn tipo de traumatismo.  En su hogar hay violencia mental o fsica. Document Released: 08/22/2005 Document Revised: 05/13/2012 [employer] Patient Information 2013 [employer].  

## 2012-12-02 ENCOUNTER — Other Ambulatory Visit: Payer: Self-pay | Admitting: Advanced Practice Midwife

## 2012-12-02 DIAGNOSIS — IMO0002 Reserved for concepts with insufficient information to code with codable children: Secondary | ICD-10-CM

## 2012-12-03 ENCOUNTER — Encounter: Payer: Self-pay | Admitting: Advanced Practice Midwife

## 2012-12-04 ENCOUNTER — Ambulatory Visit (HOSPITAL_COMMUNITY)
Admission: RE | Admit: 2012-12-04 | Discharge: 2012-12-04 | Disposition: A | Discharge status: Home / Self Care | Payer: Self-pay | Source: Ambulatory Visit | Attending: Advanced Practice Midwife | Admitting: Advanced Practice Midwife

## 2012-12-04 ENCOUNTER — Encounter: Payer: Self-pay | Admitting: Advanced Practice Midwife

## 2012-12-04 VITALS — BP 127/70 | HR 85 | Wt 189.0 lb

## 2012-12-04 DIAGNOSIS — Z3480 Encounter for supervision of other normal pregnancy, unspecified trimester: Secondary | ICD-10-CM

## 2012-12-04 DIAGNOSIS — O358XX0 Maternal care for other (suspected) fetal abnormality and damage, not applicable or unspecified: Secondary | ICD-10-CM | POA: Insufficient documentation

## 2012-12-04 DIAGNOSIS — O34219 Maternal care for unspecified type scar from previous cesarean delivery: Secondary | ICD-10-CM | POA: Insufficient documentation

## 2012-12-04 DIAGNOSIS — O352XX0 Maternal care for (suspected) hereditary disease in fetus, not applicable or unspecified: Secondary | ICD-10-CM | POA: Insufficient documentation

## 2012-12-04 DIAGNOSIS — IMO0002 Reserved for concepts with insufficient information to code with codable children: Secondary | ICD-10-CM

## 2012-12-04 DIAGNOSIS — H0469 Other changes of lacrimal passages: Secondary | ICD-10-CM

## 2012-12-04 NOTE — Progress Notes (Signed)
[facility] ultrasound  Indication: 27 yr old G3P2003 at [redacted]w[redacted]d for fetal ultrasound secondary to previous finding of dacrocystocele in fetus.  Findings: 1. Single intrauterine pregnancy. 2. Estimated fetal weight is in the 44th%. 3. Posterior placenta without evidence of previa. 4. Normal amniotic fluid index. 5. The limited anatomy survey is normal. 6. The dacrocystocele is not visualized and appears to have resolved.  Recommendations: 1. Appropriate fetal growth. 2. Previous finding of dacrocystocele: - previously counseled - appears to have resolved - recommend inform Pediatrician at time of delivery 3. In review of the chart there was mention of a "heart problem" in a previous child - recommend if this was a congenital heart defect that the Pediatricians be informed and consider a neonatal echocardiogram - this fetus had a normal anatomy survey; but no echocardiogram was done 4. Recommend follow up as clinically indicated  Eulis Foster, MD

## 2012-12-10 ENCOUNTER — Encounter: Payer: Self-pay | Admitting: Advanced Practice Midwife

## 2012-12-10 ENCOUNTER — Encounter: Payer: Self-pay | Admitting: *Deleted

## 2012-12-10 ENCOUNTER — Ambulatory Visit (INDEPENDENT_AMBULATORY_CARE_PROVIDER_SITE_OTHER): Payer: Self-pay | Admitting: Advanced Practice Midwife

## 2012-12-10 VITALS — BP 117/74 | Temp 97.1°F | Wt 185.0 lb

## 2012-12-10 DIAGNOSIS — O34219 Maternal care for unspecified type scar from previous cesarean delivery: Secondary | ICD-10-CM

## 2012-12-10 LAB — POCT URINALYSIS DIP (DEVICE)
Ketones, ur: NEGATIVE mg/dL
Leukocytes, UA: NEGATIVE
Nitrite: NEGATIVE
Protein, ur: NEGATIVE mg/dL
Urobilinogen, UA: 0.2 mg/dL (ref 0.0–1.0)

## 2012-12-10 LAB — OB RESULTS CONSOLE GBS: GBS: NEGATIVE

## 2012-12-10 NOTE — Progress Notes (Signed)
Cultures and GBS done today. Was going to have TOLAC, but now wants Repeat C/S. Wants BTL. Informed her it will cost about $3000-5000 extra in addition to C/S. She is applying for Medicaid today. Informed her of 30 day rule, papers to be signed today. Discussed permanency of BTL.  Interpretor used.

## 2012-12-10 NOTE — Progress Notes (Signed)
Pulse: 92

## 2012-12-10 NOTE — Patient Instructions (Signed)
Informacin sobre Engineer, civil (consulting)  (Sterilization Information, Female) La esterilizacin en la mujer es un procedimiento que se realiza para Location manager de [place]. Hay diferentes formas de Futures trader, Biomedical engineer en todos los casos se bloquean o se cierran las trompas de [place] para que vulos no puedan llegar al tero. Si el vulo no llega al tero, los espermatozoides no fertilizan el vulo, y usted no podr [place].  La esterilizacin se lleva a cabo por medio de un procedimiento quirrgico. A veces, estos procedimientos se realizan en el hospital haciendo dormir a Education officer, community. En otros casos se realiza en un consultorio en una clnica y la paciente permanece despierta. Las trompas de [employer] se pueden cortar, Public affairs consultant o sellar quirrgicamente, con un procedimiento que se llama ligadura de trompas. Otro mtodo es cerrarlas con clips o anillos. La esterilizacin tambin puede realizarse mediante la colocacin de un pequeo resorte en cada trompa de [place], lo que hace que se desarrolle tejido cicatrizal en el interior de la trompa. Luego el tejido [employer].  Comente el tema con su mdico para responder las inquietudes que usted o su pareja puedan Warehouse manager. Podr preguntarle a su mdico qu tipo de Diplomatic Services operational officer. Algunos profesionales pueden no realizar todas las opciones. La esterilizacin es permanente y slo debe hacerse si est segura de que no desea tener hijos o no desea tener ms hijos. La reversin de la esterilizacin puede no tener xito.  PROCEDIMIENTOS PARA LA ESTERILIZACIN   Esterilizacin laparoscpica. Es un mtodo quirrgico que se Biomedical engineer en otro momento que no es inmediatamente despus del [place]. Se realizan dos incisiones en la zona baja del abdomen. Un tubo delgado con una fuente de luz (laparoscopio) se inserta en una de las incisiones y se [place] para Surveyor, quantity. Las trompas de  [place] se cierran con un anillo o un clip. Podrn utilizar un instrumento que utiliza el calor para sellar las trompas (electrocauterizacin)..   Mini-laparotoma. Es un mtodo quirrgico que se Biomedical engineer 1 o 2 das despus del [place]. En general, consiste en una pequea incisin que se hace justo debajo del (ombligo) por el cual se visualizan las trompas de [place]. Las trompas pueden ser selladas, atadas o cortadas.   Esterilizacin histeroscpica. Esto se realiza en otro momento que no sea inmediatamente despus del parto. Se coloca un pequeo resorte helicoidal a travs del cuello y del cuerpo del tero y se inserta en las trompas de [place]. El resorte produce cicatrizacin y [employer] trompas. Se deben utilizar otras formas de anticoncepcin durante 3 meses despus del procedimiento para permitir que el tejido cicatrizal se forme completamente. Adems, es necesario hacer una histerosalpingografa despus de 3 meses para asegurarse de que el procedimiento ha sido exitoso.  La histerosalpingografa es un procedimiento en el que utilizan rayos X para observar el tero y las trompas de [place] despus de insertar un dispositivo, para asegurarse de que est bien colocado. LA ESTERILIZACIN ES UN PROCEDIMIENTO SEGURO?  La esterilizacin se considera un procedimiento seguro en el que rara vez se producen complicaciones. Los riesgos dependen del tipo de procedimiento que se realicen. Al igual que con cualquier procedimiento quirrgico, puede haber riesgos. Algunos son:   [place] N[place].  Infeccin.  Reaccin a la anestesia.  Lesin en los rganos circundantes. Los riesgos especficos de la colocacin de los resortes por histeroscopa son:   No se Production designer, theatre/television/film.   Las resortes pueden salirse del Environmental consultant.  Las trompas no quedan completamente bloqueadas despus de 3 meses.   Ocurre una lesin en los rganos adyacentes al colocarlo.  LA ESTERILIZACIN ES UN MTODO  EFECTIVO?  La esterilizacin es efectiva en casi 100% , pero puede fallar. Segn el tipo de esterilizacin, el porcentaje de fracaso puede ser tan alto como en un 3%. Despus de la esterilizacin histeroscpica con colocacin de un resorte en las trompas de [place], Pension scheme manager un mtodo anticonceptivo de respaldo durante 3 meses. La esterilizacin es efectiva durante toda la vida.  LOS BENEFICIOS DE LA ESTERILIZACIN   No afecta las hormonas, y por lo tanto no afectar sus perodos menstruales, el deseo o el rendimiento sexual, .   Es efectiva para toda la vida.   Es segura.   No tiene que preocuparse por Location manager. Tenga en cuenta que si fue sometida a este procedimiento, debe esperar 3 meses (o hasta que el mdico lo confirme) antes de considerar que no quedar embarazada.   No hay efectos secundarios a diferencia de otros tipos de control de la natalidad (contracepcin).  INCONVENIENTES DE LA ESTERILIZACIN   Usted debe estar seguro de que no desea tener hijos o ms hijos. El procedimiento es [place].   No ofrece proteccin contra las infecciones de transmisin sexual (ITS).   Las trompas [employer] a Engineer, building services. Si esto ocurre, habr riesgo de embarazo. Tambin hay un mayor riesgo (50%) que el embarazo sea ectpico. Se llama as al embarazo que ocurre fuera del tero. Document Released: 04/30/2008 Document Revised: 05/13/2012 [facility] Patient Information 2013 [place], [place]. Sterilization Information, Female Female sterilization is a procedure to permanently prevent pregnancy. There are different ways to perform sterilization, but all either block or close the fallopian tubes so that your eggs cannot reach your uterus. If your egg cannot reach your uterus, sperm cannot fertilize the egg, and you cannot get pregnant.  Sterilization is performed by a surgical procedure. Sometimes these procedures are performed in a hospital while a patient is asleep. Sometimes they  can be done in a clinic setting with the patient awake. The fallopian tubes can be surgically cut, tied, or sealed through a procedure called tubal ligation. The fallopian tubes can also be closed with clips or rings. Sterilization can also be done by placing a tiny coil into each fallopian tube, which causes scar tissue to grow inside the tube. The scar tissue then blocks the tubes.  Discuss sterilization with your caregiver to answer any concerns you or your partner may have. You may want to ask what type of sterilization your caregiver performs. Some caregivers may not perform all the various options. Sterilization is permanent and should only be done if you are sure you do not want children or do not want any more children. Having a sterilization reversed may not be successful.  STERILIZATION PROCEDURES  Laparoscopic sterilization. This is a surgical method performed at a time other than right after childbirth. Two incisions are made in the lower abdomen. A thin, lighted tube (laparoscope) is inserted into one of the incisions and is used to perform the procedure. The fallopian tubes are closed with a ring or a clip. An instrument that uses heat could be used to seal the tubes closed (electrocautery).   Mini-laparotomy. This is a surgical method done 1 or 2 days after giving birth. Typically, a small incision is made just below the belly button (umbilicus) and the fallopian tubes are exposed. The tubes can then be sealed, tied, or cut.  Hysteroscopic sterilization. This is performed at a time other than right after childbirth. A tiny, spring-like coil is inserted through the cervix and uterus and placed into the fallopian tubes. The coil causes scaring and blocks the tubes. Other forms of contraception should be used for 3 months after the procedure to allow the scar tissue to form completely. Additionally, it is required hysterosalpingography be done 3 months later to ensure that the procedure was  successful. Hysterosalpingography is a procedure that uses X-rays to look at your uterus and fallopian tubes after a material to make them show up better has been inserted. IS STERILIZATION SAFE? Sterilization is considered safe with very rare complications. Risks depend on the type of procedure you have. As with any surgical procedure, there are risks. Some risks of sterilization by any means include:   Bleeding.  Infection.  Reaction to anesthesia medicine.  Injury to surrounding organs. Risks specific to having hysteroscopic coils placed include:  The coils may not be placed correctly the first time.   The coils may move out of place.   The tubes may not get completely blocked after 3 months.   Injury to surrounding organs when placing the coil.  HOW EFFECTIVE IS FEMALE STERILIZATION? Sterilization is nearly 100% effective, but it can fail. Depending on the type of sterilization, the rate of failure can be as high as 3%. After hysteroscopic sterilization with placement of fallopian tube coils, you will need back-up birth control for 3 months after the procedure. Sterilization is effective for a lifetime.  BENEFITS OF STERILIZATION  It does not affect your hormones, and therefore will not affect your menstrual periods, sexual desire, or performance.   It is effective for a lifetime.   It is safe.   You do not need to worry about getting pregnant. Keep in mind that if you had the hysteroscopic placement procedure, you must wait 3 months after the procedure (or until your caregiver confirms) before pregnancy is not considered possible.   There are no side effects unlike other types of birth control (contraception).  DRAWBACKS OF STERILIZATION  You must be sure you do not want children or any more children. The procedure is permanent.   It does not provide protection against sexually transmitted infections (STIs).   The tubes can grow back together. If this happens,  there is a risk of pregnancy. There is also an increased risk (50%) of pregnancy being an ectopic pregnancy. This is a pregnancy that happens outside of the uterus. Document Released: 04/30/2008 Document Revised: 05/13/2012 Document Reviewed: 02/28/2012 [facility] Patient Information 2013 [place], [place].

## 2012-12-13 LAB — CULTURE, BETA STREP (GROUP B ONLY)

## 2012-12-15 ENCOUNTER — Other Ambulatory Visit: Payer: Self-pay | Admitting: Obstetrics & Gynecology

## 2012-12-20 ENCOUNTER — Emergency Department (HOSPITAL_COMMUNITY): Payer: Self-pay

## 2012-12-20 ENCOUNTER — Emergency Department (HOSPITAL_COMMUNITY)
Admission: EM | Admit: 2012-12-20 | Discharge: 2012-12-20 | Disposition: A | Discharge status: Home / Self Care | Payer: Self-pay | Attending: Emergency Medicine | Admitting: Emergency Medicine

## 2012-12-20 ENCOUNTER — Encounter (HOSPITAL_COMMUNITY): Payer: Self-pay | Admitting: *Deleted

## 2012-12-20 DIAGNOSIS — R079 Chest pain, unspecified: Secondary | ICD-10-CM | POA: Insufficient documentation

## 2012-12-20 DIAGNOSIS — R109 Unspecified abdominal pain: Secondary | ICD-10-CM | POA: Insufficient documentation

## 2012-12-20 DIAGNOSIS — O9989 Other specified diseases and conditions complicating pregnancy, childbirth and the puerperium: Secondary | ICD-10-CM | POA: Insufficient documentation

## 2012-12-20 DIAGNOSIS — R05 Cough: Secondary | ICD-10-CM | POA: Insufficient documentation

## 2012-12-20 DIAGNOSIS — R091 Pleurisy: Secondary | ICD-10-CM | POA: Insufficient documentation

## 2012-12-20 DIAGNOSIS — R059 Cough, unspecified: Secondary | ICD-10-CM | POA: Insufficient documentation

## 2012-12-20 LAB — URINALYSIS, ROUTINE W REFLEX MICROSCOPIC
Bilirubin Urine: NEGATIVE
Ketones, ur: NEGATIVE mg/dL
Nitrite: NEGATIVE
Urobilinogen, UA: 0.2 mg/dL (ref 0.0–1.0)

## 2012-12-20 LAB — URINE MICROSCOPIC-ADD ON

## 2012-12-20 MED ORDER — HYDROCODONE-ACETAMINOPHEN 5-325 MG PO TABS: 1.0000 | ORAL_TABLET | Freq: Four times a day (QID) | ORAL | Status: DC | PRN

## 2012-12-20 MED ORDER — HYDROCODONE-ACETAMINOPHEN 5-325 MG PO TABS
1.0000 | ORAL_TABLET | Freq: Once | ORAL | Status: AC
  Administered 2012-12-20: 1 via ORAL
  Filled 2012-12-20: qty 1

## 2012-12-20 NOTE — ED Notes (Signed)
Rx x 1.  Pt voiced understanding to f/u with OB on Wednesday and return for worsening condition.

## 2012-12-20 NOTE — ED Provider Notes (Addendum)
History     CSN: [id number]  Arrival date & time 12/20/12  1819   First MD Initiated Contact with Patient 12/20/12 1847      Chief Complaint  Patient presents with  . Flank Pain    (Consider location/radiation/quality/duration/timing/severity/associated sxs/prior treatment) HPI Comments: 27 yo G3P3 female currently [redacted] weeks pregnant who presents with left flank pain. This started one week ago and today got significantly worse. She describes it as a sharp, stabbing pain that is constant and does not radiate. The pain is worse with coughing, movement, and deep inspiration. She has also had a non-productive cough for the past week. She denies recent fevers, dysuria, hematuria, frequency/urgency, vaginal discharge or bleeding, or contractions. Review of systems is otherwise negative. She does feel fetal movements, and has had no complications with her pregnancy or her previous pregnancies.  Patient is a 27 y.o. female presenting with flank pain. The history is provided by the patient.  Flank Pain Associated symptoms include chest pain. Pertinent negatives include no abdominal pain and no shortness of breath.    Past Medical History  Diagnosis Date  . No pertinent past medical history   . Supervision of normal subsequent pregnancy 10/15/2012  . History of cesarean delivery 08/19/2005    Past Surgical History  Procedure Date  . Cesarean section     Family History  Problem Relation Age of Onset  . Depression Mother   . Diabetes Mother   . Hypertension Mother   . Heart murmur Daughter     History  Substance Use Topics  . Smoking status: Never Smoker   . Smokeless tobacco: Never Used  . Alcohol Use: No    OB History    Grav Para Term Preterm Abortions TAB SAB Ect Mult Living   3 2 2      1 3       Review of Systems  Constitutional: Negative for activity change.  HENT: Negative for facial swelling and neck pain.   Respiratory: Negative for cough, shortness of breath and  wheezing.   Cardiovascular: Positive for chest pain.  Gastrointestinal: Negative for nausea, vomiting, abdominal pain, diarrhea, constipation, blood in stool and abdominal distention.  Genitourinary: Positive for flank pain. Negative for hematuria and difficulty urinating.  Skin: Negative for color change.  Neurological: Negative for speech difficulty.  Hematological: Does not bruise/bleed easily.  Psychiatric/Behavioral: Negative for confusion.    Allergies  Review of patient's allergies indicates no known allergies.  Home Medications   Current Outpatient Rx  Name  Route  Sig  Dispense  Refill  . PRENATAL VITAMINS PO   Oral   Take 1 tablet by mouth daily.           BP 124/85  Pulse 82  Temp 97.6 F (36.4 C) (Oral)  Resp 16  SpO2 100%  LMP 04/09/2012  Physical Exam  Nursing note and vitals reviewed. Constitutional: She is oriented to person, place, and time. She appears well-developed.  HENT:  Head: Normocephalic and atraumatic.  Eyes: Conjunctivae normal and EOM are normal. Pupils are equal, round, and reactive to light.  Neck: Normal range of motion. Neck supple.  Cardiovascular: Normal rate, regular rhythm and normal heart sounds.   Pulmonary/Chest: Effort normal and breath sounds normal. No respiratory distress.  Abdominal: Soft. Bowel sounds are normal. She exhibits no distension. There is no tenderness. There is no rebound and no guarding.  Genitourinary:       Reproducible flank pain, no rash seen  Neurological: She  is alert and oriented to person, place, and time.  Skin: Skin is warm and dry.    ED Course  Procedures (including critical care time)  Labs Reviewed  URINALYSIS, ROUTINE W REFLEX MICROSCOPIC - Abnormal; Notable for the following:    APPearance CLOUDY (*)     Leukocytes, UA SMALL (*)     All other components within normal limits  URINE MICROSCOPIC-ADD ON - Abnormal; Notable for the following:    Squamous Epithelial / LPF MANY (*)      Bacteria, UA MANY (*)     All other components within normal limits  URINE CULTURE  URINE CULTURE   No results found.   No diagnosis found.    MDM  Pt comes in with cc of flank pain.  Pt is in her 3rd trimerster. No UTI like sx, and no sx consistent with renal stones. Will check UA - if UA is positive - she has a pyelonephritis. Will check Cr. No need for renal ultrasound. Pt has no evidence of shingles. The pain is pleuritic. Will get a CXR. Will get fetal heart tones.  Derwood Kaplan, MD 12/20/12 2032  9:30 PM Spoke with attending at [facility]. I discussed the case with her, and informed her that patient has normal vitals, no DIB, and right sided pleuritic and musculoskeletal chest pain (worse with cough and palpation), and so i dont think she has PE and doesn't require CT. OB doctor agrees with the plan. PT asked to take norco, and if not getting better, mention it to her OB doctor on Wednesday when she goes for f.u.  Pt advised to come to the ER immediately if she has any worsening of her symptoms.    Derwood Kaplan, MD 12/20/12 2132   Date: 12/20/2012  Rate: 92  Rhythm: normal sinus rhythm  QRS Axis: normal  Intervals: normal  ST/T Wave abnormalities: normal  Conduction Disutrbances: none  Narrative Interpretation: unremarkable      Derwood Kaplan, MD 12/20/12 2157

## 2012-12-20 NOTE — ED Notes (Signed)
Pt is here for pain in her right side.  Pt is due 2/18 with a C-section scheduled for 2/12 (due to previous c-section).  Pt states that the pain is constant and increases with movement.  Pt states that the pain is stabbing with movement. Pt denies any leaking of fluid or blood from the vagina.  Pt also denies any pain, burning or blood with urination.  Pt feels that baby move as usual.

## 2012-12-22 LAB — URINE CULTURE

## 2012-12-24 ENCOUNTER — Ambulatory Visit (INDEPENDENT_AMBULATORY_CARE_PROVIDER_SITE_OTHER): Payer: Self-pay | Admitting: Family

## 2012-12-24 VITALS — BP 129/81 | Temp 97.1°F | Wt 186.9 lb

## 2012-12-24 DIAGNOSIS — O34219 Maternal care for unspecified type scar from previous cesarean delivery: Secondary | ICD-10-CM

## 2012-12-24 DIAGNOSIS — R05 Cough: Secondary | ICD-10-CM

## 2012-12-24 DIAGNOSIS — Z3480 Encounter for supervision of other normal pregnancy, unspecified trimester: Secondary | ICD-10-CM

## 2012-12-24 LAB — CBC
MCH: 27.5 pg (ref 26.0–34.0)
MCHC: 33.1 g/dL (ref 30.0–36.0)
MCV: 83.1 fL (ref 78.0–100.0)
Platelets: 231 10*3/uL (ref 150–400)
RDW: 13.2 % (ref 11.5–15.5)
WBC: 8.2 10*3/uL (ref 4.0–10.5)

## 2012-12-24 LAB — POCT URINALYSIS DIP (DEVICE)
Ketones, ur: NEGATIVE mg/dL
Leukocytes, UA: NEGATIVE
Protein, ur: NEGATIVE mg/dL

## 2012-12-24 MED ORDER — GUAIFENESIN-CODEINE 100-10 MG/5ML PO SYRP: 5.0000 mL | ORAL_SOLUTION | Freq: Three times a day (TID) | ORAL | Status: DC | PRN

## 2012-12-24 NOTE — Progress Notes (Signed)
Seen in ED on 1/25 for flank pain > pt released with pain medication after xray did not show signs of infection, "Appearance of fluid within the right minor and major fissure. Findings may represent changes of congestive heart failure. Recommend follow-up until clearance";  pt reports continued pain, no fever, continuous cough.  Lungs CTA; O2 sat: 99%   Consulted with Dr. Debroah Loop, reviewed previous ED visit, xray results, today exam > CBC today, cough/pain medication with follow-up in high risk tomorrow; No GC/CT results on record, obtain today.

## 2012-12-24 NOTE — Progress Notes (Signed)
Pulse: 65

## 2012-12-25 ENCOUNTER — Encounter (HOSPITAL_COMMUNITY)
Admission: AD | Disposition: A | Discharge status: Home / Self Care | Payer: Self-pay | Source: Ambulatory Visit | Attending: Obstetrics and Gynecology

## 2012-12-25 ENCOUNTER — Encounter (HOSPITAL_COMMUNITY): Payer: Self-pay | Admitting: Anesthesiology

## 2012-12-25 ENCOUNTER — Inpatient Hospital Stay (HOSPITAL_COMMUNITY): Payer: Medicaid Other | Admitting: Anesthesiology

## 2012-12-25 ENCOUNTER — Encounter (HOSPITAL_COMMUNITY): Payer: Self-pay | Admitting: *Deleted

## 2012-12-25 ENCOUNTER — Encounter: Payer: Self-pay | Admitting: Family Medicine

## 2012-12-25 ENCOUNTER — Inpatient Hospital Stay (HOSPITAL_COMMUNITY)
Admission: AD | Admit: 2012-12-25 | Discharge: 2012-12-27 | DRG: 766 | Disposition: A | Discharge status: Home / Self Care | Payer: Medicaid Other | Source: Ambulatory Visit | Attending: Obstetrics and Gynecology | Admitting: Obstetrics and Gynecology

## 2012-12-25 DIAGNOSIS — IMO0001 Reserved for inherently not codable concepts without codable children: Secondary | ICD-10-CM

## 2012-12-25 DIAGNOSIS — O34219 Maternal care for unspecified type scar from previous cesarean delivery: Principal | ICD-10-CM | POA: Diagnosis present

## 2012-12-25 DIAGNOSIS — Z302 Encounter for sterilization: Secondary | ICD-10-CM

## 2012-12-25 LAB — TYPE AND SCREEN

## 2012-12-25 SURGERY — Surgical Case
Anesthesia: Spinal

## 2012-12-25 MED ORDER — DIPHENHYDRAMINE HCL 25 MG PO CAPS: 25.0000 mg | ORAL_CAPSULE | Freq: Four times a day (QID) | ORAL | Status: DC | PRN

## 2012-12-25 MED ORDER — KETOROLAC TROMETHAMINE 30 MG/ML IJ SOLN
30.0000 mg | Freq: Four times a day (QID) | INTRAMUSCULAR | Status: AC | PRN
  Filled 2012-12-25: qty 1

## 2012-12-25 MED ORDER — LACTATED RINGERS IV SOLN
INTRAVENOUS | Status: DC | PRN
  Administered 2012-12-25 (×3): via INTRAVENOUS

## 2012-12-25 MED ORDER — NALBUPHINE HCL 10 MG/ML IJ SOLN
5.0000 mg | INTRAMUSCULAR | Status: DC | PRN
  Filled 2012-12-25: qty 1

## 2012-12-25 MED ORDER — SCOPOLAMINE 1 MG/3DAYS TD PT72
1.0000 | MEDICATED_PATCH | Freq: Once | TRANSDERMAL | Status: DC
  Administered 2012-12-25: 1.5 mg via TRANSDERMAL

## 2012-12-25 MED ORDER — TERBUTALINE SULFATE 1 MG/ML IJ SOLN
INTRAMUSCULAR | Status: AC
  Filled 2012-12-25: qty 1

## 2012-12-25 MED ORDER — CEFAZOLIN SODIUM-DEXTROSE 2-3 GM-% IV SOLR
INTRAVENOUS | Status: DC | PRN
  Administered 2012-12-25: 2 g via INTRAVENOUS

## 2012-12-25 MED ORDER — SCOPOLAMINE 1 MG/3DAYS TD PT72
MEDICATED_PATCH | TRANSDERMAL | Status: AC
  Filled 2012-12-25: qty 1

## 2012-12-25 MED ORDER — DIBUCAINE 1 % RE OINT: 1.0000 "application " | TOPICAL_OINTMENT | RECTAL | Status: DC | PRN

## 2012-12-25 MED ORDER — PHENYLEPHRINE 40 MCG/ML (10ML) SYRINGE FOR IV PUSH (FOR BLOOD PRESSURE SUPPORT)
PREFILLED_SYRINGE | INTRAVENOUS | Status: AC
  Filled 2012-12-25: qty 15

## 2012-12-25 MED ORDER — LACTATED RINGERS IV SOLN
INTRAVENOUS | Status: DC
  Administered 2012-12-25: 04:00:00 via INTRAVENOUS

## 2012-12-25 MED ORDER — SIMETHICONE 80 MG PO CHEW
80.0000 mg | CHEWABLE_TABLET | Freq: Three times a day (TID) | ORAL | Status: DC
  Administered 2012-12-25 – 2012-12-27 (×6): 80 mg via ORAL

## 2012-12-25 MED ORDER — SODIUM CHLORIDE 0.9 % IJ SOLN: 3.0000 mL | INTRAMUSCULAR | Status: DC | PRN

## 2012-12-25 MED ORDER — DIPHENHYDRAMINE HCL 25 MG PO CAPS
25.0000 mg | ORAL_CAPSULE | ORAL | Status: DC | PRN
Start: 2012-12-25 — End: 2012-12-27

## 2012-12-25 MED ORDER — MEPERIDINE HCL 25 MG/ML IJ SOLN
INTRAMUSCULAR | Status: AC
  Filled 2012-12-25: qty 1

## 2012-12-25 MED ORDER — OXYTOCIN 10 UNIT/ML IJ SOLN
INTRAMUSCULAR | Status: AC
  Filled 2012-12-25: qty 4

## 2012-12-25 MED ORDER — ONDANSETRON HCL 4 MG/2ML IJ SOLN
INTRAMUSCULAR | Status: AC
  Filled 2012-12-25: qty 2

## 2012-12-25 MED ORDER — LACTATED RINGERS IV SOLN
INTRAVENOUS | Status: DC
  Administered 2012-12-25: 12:00:00 via INTRAVENOUS

## 2012-12-25 MED ORDER — OXYTOCIN 40 UNITS IN LACTATED RINGERS INFUSION - SIMPLE MED: 62.5000 mL/h | INTRAVENOUS | Status: AC

## 2012-12-25 MED ORDER — FENTANYL CITRATE 0.05 MG/ML IJ SOLN
INTRAMUSCULAR | Status: DC | PRN
  Administered 2012-12-25: 25 ug via INTRATHECAL

## 2012-12-25 MED ORDER — KETOROLAC TROMETHAMINE 60 MG/2ML IM SOLN
60.0000 mg | Freq: Once | INTRAMUSCULAR | Status: AC | PRN
  Administered 2012-12-25: 60 mg via INTRAMUSCULAR

## 2012-12-25 MED ORDER — PHENYLEPHRINE HCL 10 MG/ML IJ SOLN
INTRAMUSCULAR | Status: DC | PRN
  Administered 2012-12-25 (×3): 80 ug via INTRAVENOUS
  Administered 2012-12-25 (×3): 40 ug via INTRAVENOUS
  Administered 2012-12-25 (×2): 80 ug via INTRAVENOUS

## 2012-12-25 MED ORDER — CEFAZOLIN SODIUM-DEXTROSE 2-3 GM-% IV SOLR
2.0000 g | INTRAVENOUS | Status: AC
  Administered 2012-12-25: 2 g via INTRAVENOUS
  Filled 2012-12-25: qty 50

## 2012-12-25 MED ORDER — OXYTOCIN 10 UNIT/ML IJ SOLN
40.0000 [IU] | INTRAVENOUS | Status: DC | PRN
  Administered 2012-12-25: 40 [IU] via INTRAVENOUS

## 2012-12-25 MED ORDER — METOCLOPRAMIDE HCL 5 MG/ML IJ SOLN: 10.0000 mg | Freq: Three times a day (TID) | INTRAMUSCULAR | Status: DC | PRN

## 2012-12-25 MED ORDER — MEPERIDINE HCL 25 MG/ML IJ SOLN
6.2500 mg | INTRAMUSCULAR | Status: DC | PRN
  Administered 2012-12-25: 6.25 mg via INTRAVENOUS

## 2012-12-25 MED ORDER — IBUPROFEN 600 MG PO TABS: 600.0000 mg | ORAL_TABLET | Freq: Four times a day (QID) | ORAL | Status: DC | PRN

## 2012-12-25 MED ORDER — MORPHINE SULFATE 0.5 MG/ML IJ SOLN
INTRAMUSCULAR | Status: AC
  Filled 2012-12-25: qty 10

## 2012-12-25 MED ORDER — ONDANSETRON HCL 4 MG/2ML IJ SOLN: 4.0000 mg | INTRAMUSCULAR | Status: DC | PRN

## 2012-12-25 MED ORDER — PRENATAL MULTIVITAMIN CH
1.0000 | ORAL_TABLET | Freq: Every day | ORAL | Status: DC
  Administered 2012-12-26 – 2012-12-27 (×2): 1 via ORAL
  Filled 2012-12-25 (×3): qty 1

## 2012-12-25 MED ORDER — LANOLIN HYDROUS EX OINT: 1.0000 "application " | TOPICAL_OINTMENT | CUTANEOUS | Status: DC | PRN

## 2012-12-25 MED ORDER — FAMOTIDINE IN NACL 20-0.9 MG/50ML-% IV SOLN
20.0000 mg | Freq: Once | INTRAVENOUS | Status: AC
  Administered 2012-12-25: 20 mg via INTRAVENOUS
  Filled 2012-12-25: qty 50

## 2012-12-25 MED ORDER — PHENYLEPHRINE 40 MCG/ML (10ML) SYRINGE FOR IV PUSH (FOR BLOOD PRESSURE SUPPORT)
PREFILLED_SYRINGE | INTRAVENOUS | Status: AC
  Filled 2012-12-25: qty 5

## 2012-12-25 MED ORDER — DIPHENHYDRAMINE HCL 50 MG/ML IJ SOLN: 25.0000 mg | INTRAMUSCULAR | Status: DC | PRN

## 2012-12-25 MED ORDER — CITRIC ACID-SODIUM CITRATE 334-500 MG/5ML PO SOLN
30.0000 mL | Freq: Once | ORAL | Status: AC
  Administered 2012-12-25: 30 mL via ORAL
  Filled 2012-12-25: qty 15

## 2012-12-25 MED ORDER — LACTATED RINGERS IV SOLN
INTRAVENOUS | Status: DC | PRN
  Administered 2012-12-25: 05:00:00 via INTRAVENOUS

## 2012-12-25 MED ORDER — ONDANSETRON HCL 4 MG/2ML IJ SOLN: 4.0000 mg | Freq: Three times a day (TID) | INTRAMUSCULAR | Status: DC | PRN

## 2012-12-25 MED ORDER — MORPHINE SULFATE (PF) 0.5 MG/ML IJ SOLN
INTRAMUSCULAR | Status: DC | PRN
  Administered 2012-12-25: .1 mg via INTRATHECAL

## 2012-12-25 MED ORDER — TETANUS-DIPHTH-ACELL PERTUSSIS 5-2.5-18.5 LF-MCG/0.5 IM SUSP
0.5000 mL | Freq: Once | INTRAMUSCULAR | Status: AC
  Administered 2012-12-25: 0.5 mL via INTRAMUSCULAR
  Filled 2012-12-25 (×2): qty 0.5

## 2012-12-25 MED ORDER — SIMETHICONE 80 MG PO CHEW: 80.0000 mg | CHEWABLE_TABLET | ORAL | Status: DC | PRN

## 2012-12-25 MED ORDER — MENTHOL 3 MG MT LOZG: 1.0000 | LOZENGE | OROMUCOSAL | Status: DC | PRN

## 2012-12-25 MED ORDER — BUPIVACAINE IN DEXTROSE 0.75-8.25 % IT SOLN
INTRATHECAL | Status: DC | PRN
  Administered 2012-12-25: 1.6 mL via INTRATHECAL

## 2012-12-25 MED ORDER — FENTANYL CITRATE 0.05 MG/ML IJ SOLN
INTRAMUSCULAR | Status: AC
  Filled 2012-12-25: qty 2

## 2012-12-25 MED ORDER — DIPHENHYDRAMINE HCL 50 MG/ML IJ SOLN: 12.5000 mg | INTRAMUSCULAR | Status: DC | PRN

## 2012-12-25 MED ORDER — LACTATED RINGERS IV SOLN: INTRAVENOUS | Status: DC

## 2012-12-25 MED ORDER — NALOXONE HCL 0.4 MG/ML IJ SOLN: 0.4000 mg | INTRAMUSCULAR | Status: DC | PRN

## 2012-12-25 MED ORDER — NALOXONE HCL 1 MG/ML IJ SOLN: 1.0000 ug/kg/h | INTRAVENOUS | Status: DC | PRN

## 2012-12-25 MED ORDER — KETOROLAC TROMETHAMINE 60 MG/2ML IM SOLN
INTRAMUSCULAR | Status: AC
  Filled 2012-12-25: qty 2

## 2012-12-25 MED ORDER — ZOLPIDEM TARTRATE 5 MG PO TABS: 5.0000 mg | ORAL_TABLET | Freq: Every evening | ORAL | Status: DC | PRN

## 2012-12-25 MED ORDER — SENNOSIDES-DOCUSATE SODIUM 8.6-50 MG PO TABS
2.0000 | ORAL_TABLET | Freq: Every day | ORAL | Status: DC
  Administered 2012-12-25 – 2012-12-26 (×2): 2 via ORAL

## 2012-12-25 MED ORDER — IBUPROFEN 600 MG PO TABS
600.0000 mg | ORAL_TABLET | Freq: Four times a day (QID) | ORAL | Status: DC
  Administered 2012-12-25 – 2012-12-27 (×7): 600 mg via ORAL
  Filled 2012-12-25 (×7): qty 1

## 2012-12-25 MED ORDER — ONDANSETRON HCL 4 MG PO TABS: 4.0000 mg | ORAL_TABLET | ORAL | Status: DC | PRN

## 2012-12-25 MED ORDER — OXYCODONE-ACETAMINOPHEN 5-325 MG PO TABS
1.0000 | ORAL_TABLET | ORAL | Status: DC | PRN
  Administered 2012-12-25 – 2012-12-26 (×2): 2 via ORAL
  Administered 2012-12-26 – 2012-12-27 (×3): 1 via ORAL
  Filled 2012-12-25: qty 2
  Filled 2012-12-25 (×2): qty 1
  Filled 2012-12-25: qty 2
  Filled 2012-12-25: qty 1

## 2012-12-25 MED ORDER — ONDANSETRON HCL 4 MG/2ML IJ SOLN
INTRAMUSCULAR | Status: DC | PRN
  Administered 2012-12-25: 4 mg via INTRAVENOUS

## 2012-12-25 MED ORDER — KETOROLAC TROMETHAMINE 30 MG/ML IJ SOLN
30.0000 mg | Freq: Four times a day (QID) | INTRAMUSCULAR | Status: AC | PRN
  Administered 2012-12-25 (×2): 30 mg via INTRAVENOUS
  Filled 2012-12-25: qty 1

## 2012-12-25 MED ORDER — WITCH HAZEL-GLYCERIN EX PADS: 1.0000 "application " | MEDICATED_PAD | CUTANEOUS | Status: DC | PRN

## 2012-12-25 MED ORDER — TERBUTALINE SULFATE 1 MG/ML IJ SOLN
0.2500 mg | Freq: Once | INTRAMUSCULAR | Status: AC
  Administered 2012-12-25: 1 mg via SUBCUTANEOUS

## 2012-12-25 SURGICAL SUPPLY — 34 items
BENZOIN TINCTURE PRP APPL 2/3 (GAUZE/BANDAGES/DRESSINGS) IMPLANT
CLOTH BEACON ORANGE TIMEOUT ST (SAFETY) ×2 IMPLANT
DRAPE LG THREE QUARTER DISP (DRAPES) ×2 IMPLANT
DRSG COVADERM 4X10 (GAUZE/BANDAGES/DRESSINGS) ×2 IMPLANT
DRSG OPSITE POSTOP 4X10 (GAUZE/BANDAGES/DRESSINGS) ×2 IMPLANT
DURAPREP 26ML APPLICATOR (WOUND CARE) ×2 IMPLANT
ELECT REM PT RETURN 9FT ADLT (ELECTROSURGICAL) ×2
ELECTRODE REM PT RTRN 9FT ADLT (ELECTROSURGICAL) ×1 IMPLANT
EXTRACTOR VACUUM KIWI (MISCELLANEOUS) IMPLANT
GLOVE BIO SURGEON ST LM GN SZ9 (GLOVE) ×2 IMPLANT
GLOVE BIOGEL PI IND STRL 9 (GLOVE) ×2 IMPLANT
GLOVE BIOGEL PI INDICATOR 9 (GLOVE) ×2
GOWN PREVENTION PLUS LG XLONG (DISPOSABLE) ×2 IMPLANT
GOWN PREVENTION PLUS XLARGE (GOWN DISPOSABLE) ×2 IMPLANT
GOWN STRL REIN 3XL LVL4 (GOWN DISPOSABLE) ×2 IMPLANT
NEEDLE HYPO 25X5/8 SAFETYGLIDE (NEEDLE) IMPLANT
NS IRRIG 1000ML POUR BTL (IV SOLUTION) ×2 IMPLANT
PACK C SECTION WH (CUSTOM PROCEDURE TRAY) ×2 IMPLANT
PAD OB MATERNITY 4.3X12.25 (PERSONAL CARE ITEMS) ×2 IMPLANT
RETRACTOR WND ALEXIS 25 LRG (MISCELLANEOUS) IMPLANT
RTRCTR C-SECT PINK 25CM LRG (MISCELLANEOUS) IMPLANT
RTRCTR WOUND ALEXIS 25CM LRG (MISCELLANEOUS)
SLEEVE SCD COMPRESS KNEE MED (MISCELLANEOUS) IMPLANT
STRIP CLOSURE SKIN 1/2X4 (GAUZE/BANDAGES/DRESSINGS) IMPLANT
SUT CHROMIC 0 CTX 36 (SUTURE) ×4 IMPLANT
SUT VIC AB 0 CT1 27 (SUTURE) ×1
SUT VIC AB 0 CT1 27XBRD ANBCTR (SUTURE) ×1 IMPLANT
SUT VIC AB 2-0 CT1 27 (SUTURE) ×2
SUT VIC AB 2-0 CT1 TAPERPNT 27 (SUTURE) ×2 IMPLANT
SUT VIC AB 4-0 KS 27 (SUTURE) ×2 IMPLANT
SYR BULB IRRIGATION 50ML (SYRINGE) IMPLANT
TOWEL OR 17X24 6PK STRL BLUE (TOWEL DISPOSABLE) ×2 IMPLANT
TRAY FOLEY CATH 14FR (SET/KITS/TRAYS/PACK) ×2 IMPLANT
WATER STERILE IRR 1000ML POUR (IV SOLUTION) ×2 IMPLANT

## 2012-12-25 NOTE — Transfer of Care (Signed)
Immediate Anesthesia Transfer of Care Note  Patient: Kimberly Pope  Procedure(s) Performed: Procedure(s) (LRB) with comments: CESAREAN SECTION (N/A)  Patient Location: PACU  Anesthesia Type:Spinal  Level of Consciousness: awake, alert  and oriented  Airway & Oxygen Therapy: Patient Spontanous Breathing  Post-op Assessment: Report given to PACU RN  Post vital signs: Reviewed and stable  Complications: No apparent anesthesia complications

## 2012-12-25 NOTE — Anesthesia Postprocedure Evaluation (Signed)
  Anesthesia Post-op Note  Patient: Kimberly Pope  Procedure(s) Performed: Procedure(s) (LRB): CESAREAN SECTION (N/A)  Patient Location: PACU  Anesthesia Type: Spinal  Level of Consciousness: awake and alert   Airway and Oxygen Therapy: Patient Spontanous Breathing  Post-op Pain: mild  Post-op Assessment: Post-op Vital signs reviewed, Patient's Cardiovascular Status Stable, Respiratory Function Stable, Patent Airway and No signs of Nausea or vomiting  Last Vitals:  Filed Vitals:   12/25/12 0600  BP: 106/64  Pulse: 71  Temp:   Resp: 22    Post-op Vital Signs: stable   Complications: No apparent anesthesia complications

## 2012-12-25 NOTE — Anesthesia Preprocedure Evaluation (Signed)
Anesthesia Evaluation  Patient identified by MRN, date of birth, ID band Patient awake    Reviewed: Allergy & Precautions, H&P , NPO status , Patient's Chart, lab work & pertinent test results  Airway Mallampati: II TM Distance: >3 FB Neck ROM: Full    Dental No notable dental hx.    Pulmonary neg pulmonary ROS,  breath sounds clear to auscultation  Pulmonary exam normal       Cardiovascular negative cardio ROS  Rhythm:Regular Rate:Normal     Neuro/Psych negative neurological ROS  negative psych ROS   GI/Hepatic negative GI ROS, Neg liver ROS,   Endo/Other  negative endocrine ROS  Renal/GU negative Renal ROS  negative genitourinary   Musculoskeletal negative musculoskeletal ROS (+)   Abdominal   Peds negative pediatric ROS (+)  Hematology negative hematology ROS (+)   Anesthesia Other Findings   Reproductive/Obstetrics negative OB ROS                           Anesthesia Physical Anesthesia Plan  ASA: II and emergent  Anesthesia Plan: Spinal   Post-op Pain Management:    Induction:   Airway Management Planned:   Additional Equipment:   Intra-op Plan:   Post-operative Plan:   Informed Consent: I have reviewed the patients History and Physical, chart, labs and discussed the procedure including the risks, benefits and alternatives for the proposed anesthesia with the patient or authorized representative who has indicated his/her understanding and acceptance.   Dental advisory given  Plan Discussed with:   Anesthesia Plan Comments:         Anesthesia Quick Evaluation

## 2012-12-25 NOTE — MAU Note (Signed)
Pt states she is having contractions and is concerned

## 2012-12-25 NOTE — Anesthesia Procedure Notes (Signed)
Spinal  Patient location during procedure: OB Staffing Anesthesiologist: Phillips Grout Performed by: anesthesiologist  Preanesthetic Checklist Completed: patient identified, site marked, surgical consent, pre-op evaluation, timeout performed, IV checked, risks and benefits discussed and monitors and equipment checked Spinal Block Patient position: sitting Prep: ChloraPrep Patient monitoring: heart rate, continuous pulse ox and blood pressure Approach: midline Location: L4-5 Injection technique: single-shot Needle Needle type: Sprotte  Needle gauge: 24 G Needle length: 9 cm Assessment Sensory level: T4 Additional Notes Expiration date of kit checked and confirmed. Patient tolerated procedure well, without complications.

## 2012-12-25 NOTE — Progress Notes (Signed)
UR chart review completed.  

## 2012-12-25 NOTE — Op Note (Signed)
See operative note details included in the brief operative note section

## 2012-12-25 NOTE — MAU Note (Signed)
FHT 145-150 x 3 min post spinal per doppler-d/c'd for abd prep

## 2012-12-25 NOTE — H&P (Signed)
Kimberly Pope is a 27 y.o. female G3P2003 with IUP at [redacted]w[redacted]d presenting for contractions. Pt states she has been having regular, every 2-4 minutes contractions, associated with none vaginal bleeding, membranes are intact, ruptured in MAU @ 0250, with active fetal movement.   [facility] 27 wks  Prenatal History/Complications: Late care Past Medical History: Past Medical History  Diagnosis Date  . No pertinent past medical history   . Supervision of normal subsequent pregnancy 10/15/2012  . History of cesarean delivery 08/19/2005    Past Surgical History: Past Surgical History  Procedure Date  . Cesarean section     Obstetrical History: OB History    Grav Para Term Preterm Abortions TAB SAB Ect Mult Living   3 2 2      1 3       Gynecological History: OB History    Grav Para Term Preterm Abortions TAB SAB Ect Mult Living   3 2 2      1 3       Social History: History   Social History  . Marital Status: Single    Spouse Name: N/A    Number of Children: N/A  . Years of Education: N/A   Social History Main Topics  . Smoking status: Never Smoker   . Smokeless tobacco: Never Used  . Alcohol Use: No  . Drug Use: No  . Sexually Active: Yes    Birth Control/ Protection: None   Other Topics Concern  . None   Social History Narrative  . None    Family History: Family History  Problem Relation Age of Onset  . Depression Mother   . Diabetes Mother   . Hypertension Mother   . Heart murmur Daughter     Allergies: No Known Allergies  Prescriptions prior to admission  Medication Sig Dispense Refill  . guaiFENesin-codeine (ROBITUSSIN AC) 100-10 MG/5ML syrup Take 5 mLs by mouth 3 (three) times daily as needed for cough.  180 mL  0  . HYDROcodone-acetaminophen (NORCO/VICODIN) 5-325 MG per tablet Take 1 tablet by mouth every 6 (six) hours as needed for pain.  12 tablet  0  . PRENATAL VITAMINS PO Take 1 tablet by mouth daily.        Review of  Systems - Negative except contractions Is being treated for pleurisy with CXR from 12/20/12 *RADIOLOGY REPORT*  Clinical Data: Left posterior pain for past week.  CHEST - 2 VIEW  Comparison: None.  Findings: Elevated right hemidiaphragm.  Pulmonary vascular congestion.  Appearance of fluid within the right minor and major fissure.  Findings may represent changes of congestive heart failure.  Recommend follow-up until clearance.  No pneumothorax.  Heart size top normal.  No obvious fracture.  Possibility of pulmonary embolus not adequately assessed by plain  film examination.  IMPRESSION:  Elevated right hemidiaphragm.  Pulmonary vascular congestion. Appearance of fluid within the  right minor and major fissure. Findings may represent changes of  congestive heart failure. Recommend follow-up until clearance.  No pneumothorax.  Heart size top normal.  Original Report Authenticated By: Lacy Duverney, M.D.   Blood pressure 134/75, pulse 75, temperature 98.1 F (36.7 C), resp. rate 20, height 5\' 2"  (1.575 m), weight 189 lb (85.73 kg), last menstrual period 04/09/2012, SpO2 100.00%. General appearance: alert, cooperative and no distress Lungs: clear to auscultation bilaterally Heart: regular rate and rhythm Abdomen: soft, non-tender; bowel sounds normal Pelvic: 4/80/-2/SROM clear fluid Extremities: Homans sign is negative, no sign of DVT DTR's 2+ Presentation:  cephalic Fetal monitoringBaseline: 140 bpm, Variability: Good {> 6 bpm), Accelerations: Reactive and Decelerations: Absent Uterine activity  q 2-4 minutes Dilation: 4 Effacement (%): 90 Station: -2 Exam by:: F.Dishmon,CNM   Prenatal labs: ABO, Rh: O/POS/-- (11/20 1209) Antibody: NEG (11/20 1209) Rubella:   RPR: NON REAC (11/20 1209)  HBsAg: NEGATIVE (11/20 1209)  HIV: NON REACTIVE (11/20 1209)  GBS: Negative (01/15 0000)  1 hr Glucola 110 Genetic screening  Too late Anatomy US:  Had clogged tear  duct--resolved    Assessment: Kimberly Pope is a 27 y.o. G3P2003 with an IUP at [redacted]w[redacted]d presenting for active labor  Plan: Repeat C-Section   CRESENZO-DISHMAN,Kaylon Hitz 12/25/2012, 2:55 AM

## 2012-12-25 NOTE — Brief Op Note (Signed)
12/25/2012  5:19 AM  PATIENT:  Kimberly Pope  27 y.o. female  PRE-OPERATIVE DIAGNOSIS:  previous cesarean section, pt in labor pregnancy 37 weeks   POST-OPERATIVE DIAGNOSIS:Pregnancy 37 weeks, delivered, prior cesarean, not for tolac. Elective sterilization  PROCEDURE:  Procedure(s) (LRB) with comments: CESAREAN SECTION (N/A) with tubal ligation      SURGEON:  Surgeon(s) and Role:    * Tilda Burrow, MD - Primary  PHYSICIAN ASSISTANT: none ASSISTANTS: none  ANESTHESIA:   spinal  EBL:  Total I/O In: 2500 [I.V.:2500] Out: 950 [Urine:450; Blood:500] BLOOD ADMINISTERED:none  DRAINS: Urinary Catheter (Foley)  LOCAL MEDICATIONS USED:  LIDOCAINE   SPECIMEN:  Source of Specimen:  placenta DISPOSITION OF SPECIMEN:  labor and delivery  COUNTS:  YES TOURNIQUET:  * No tourniquets in log * DICTATION: .Dragon Dictation Patient was taken to the operating room prepped and draped for lower abdominal surgery. Timeout was conducted, Ancef 2 g administered intravenously, and procedure begun with excision the old cicatrix and 1 cm of the adjacent skin and fat, and with sharp dissection through the fascia. Excess muscles were split in the midline. Anterior peritoneum was easily entered and bladder flap developed. Thin lower uterine segment was opened. transversely and extended with the index finger traction . Fetal vertex rotated into the incision delivered with fundal pressure, and the cord clamped. See pediatricians details in their notes. Cord was clamped cord blood obtained, and placenta delivered by Cred uterine massage. Uterus was irrigated and some membrane remnants in the lower uterine segment were extracted and the internal surfaces irrigated with saline solution. Single-layer running locking closure of the uterine incision was then successfully performed with good hemostasis. Bladder flap did not require reapproximation. Abdomen was irrigated, then attention to the fallopian tubes  performed. Filshie clip was placed at the midportion of each tube with good positioning confirmed. Anterior peritoneum was closed with running 2-0 Vicryl, the fascia closed with running 0 Vicryl and the subcutaneous tissues was on the inferior side to allow for good tissue H. approximation with a transverse releasing incision in the fatty tissues and then interrupted 2-0 Vicryl sutures positioning subcutaneous fatty tissues in a good position. Subcuticular closure with 4-0 Vicryl completed the procedure. sponge and needle counts correct. PLAN OF CARE: Admit to inpatient  PATIENT DISPOSITION:  PACU - hemodynamically stable.   Delay start of Pharmacological VTE agent (>24hrs) due to surgical blood loss or risk of bleeding: not applicable

## 2012-12-25 NOTE — Progress Notes (Signed)
This 27 year female at 37+1 weeks, admitted in labor, for a repeat cesarean section and tubal ligation. Technical aspects of procedure reviewed, including risk to adjacent organs, and failure rate of sterilization of 1/500.  Pt consented . Last food 9 pm, also last liquids water at midnite. See Admit note by Cathie Beams. Will proceed to Repeat Cesarean Section and Tubal Ligation.

## 2012-12-26 ENCOUNTER — Encounter (HOSPITAL_COMMUNITY): Payer: Self-pay | Admitting: Obstetrics and Gynecology

## 2012-12-26 LAB — RPR: RPR Ser Ql: NONREACTIVE

## 2012-12-26 LAB — CBC
Platelets: 221 10*3/uL (ref 150–400)
RBC: 3.35 MIL/uL — ABNORMAL LOW (ref 3.87–5.11)
RDW: 13.6 % (ref 11.5–15.5)
WBC: 9.1 10*3/uL (ref 4.0–10.5)

## 2012-12-26 MED ORDER — BENZONATATE 100 MG PO CAPS
100.0000 mg | ORAL_CAPSULE | Freq: Three times a day (TID) | ORAL | Status: DC | PRN
  Administered 2012-12-26: 100 mg via ORAL
  Filled 2012-12-26: qty 1

## 2012-12-26 MED ORDER — GUAIFENESIN 100 MG/5ML PO SYRP
200.0000 mg | ORAL_SOLUTION | Freq: Four times a day (QID) | ORAL | Status: DC | PRN
  Filled 2012-12-26: qty 118

## 2012-12-26 NOTE — Anesthesia Postprocedure Evaluation (Signed)
  Anesthesia Post-op Note  Patient: Kimberly Pope  Procedure(s) Performed: Procedure(s) (LRB) with comments: CESAREAN SECTION (N/A)  Patient Location: Mother/Baby  Anesthesia Type:Spinal  Level of Consciousness: awake, alert  and oriented  Airway and Oxygen Therapy: Patient Spontanous Breathing  Post-op Pain: mild  Post-op Assessment: Patient's Cardiovascular Status Stable, Respiratory Function Stable, Patent Airway, No signs of Nausea or vomiting and Pain level controlled  Post-op Vital Signs: stable  Complications: No apparent anesthesia complications

## 2012-12-26 NOTE — Addendum Note (Signed)
Addendum  created 12/26/12 0751 by Lincoln Brigham, CRNA   Modules edited:Notes Section

## 2012-12-26 NOTE — Progress Notes (Signed)
Subjective: Postpartum Day 1: Cesarean Delivery due to elective repeat. BTL as well. Patient reports incisional pain, tolerating PO and no problems voiding.  Has not had bowel movement yet. Ambulating well. Pain well controlled with meds.  Objective: Vital signs in last 24 hours: Temp:  [98.1 F (36.7 C)-99.4 F (37.4 C)] 98.7 F (37.1 C) (01/31 0453) Pulse Rate:  [58-85] 65  (01/31 0453) Resp:  [18-24] 18  (01/31 0453) BP: (91-120)/(48-64) 97/60 mmHg (01/31 0453) SpO2:  [96 %-98 %] 98 % (01/31 0453)  Physical Exam:  General: alert, cooperative and no distress Lochia: appropriate Uterine Fundus: firm Incision: healing well, no significant drainage, no significant erythema, minimal blood on honey comb, no active bleeding DVT Evaluation: No evidence of DVT seen on physical exam. No cords or calf tenderness. No significant calf/ankle edema.   Basename 12/26/12 0545 12/24/12 1132  HGB 9.2* 10.7*  HCT 27.9* 32.4*    Assessment/Plan: Status post Cesarean section. Doing well postoperatively.  Continue current care.  Icela Glymph 12/26/2012, 8:00 AM

## 2012-12-26 NOTE — Progress Notes (Signed)
I saw and examined patient and agree with above. Maeson Lourenco, MD 

## 2012-12-27 MED ORDER — IBUPROFEN 600 MG PO TABS: 600.0000 mg | ORAL_TABLET | Freq: Four times a day (QID) | ORAL | Status: DC

## 2012-12-27 NOTE — Discharge Summary (Signed)
Obstetric Discharge Summary Reason for Admission: onset of labor and cesarean section Prenatal Procedures: none Intrapartum Procedures: cesarean: low cervical, transverse Postpartum Procedures: none Complications-Operative and Postpartum: none Hemoglobin  Date Value Range Status  12/26/2012 9.2* 12.0 - 15.0 g/dL Final     HCT  Date Value Range Status  12/26/2012 27.9* 36.0 - 46.0 % Final   Pt presented to MAU with reg ctx; during her time in MAU her membranes ruptured and seh was taken to the OR for planned repeat LTCS and BTL.  Physical Exam:  General: alert, cooperative and mild distress Lochia: appropriate Uterine Fundus: firm Incision: healing well, staining on left DVT Evaluation: No evidence of DVT seen on physical exam.  Discharge Diagnoses: Term Pregnancy-delivered and BTL  Discharge Information: Date: 12/27/2012 Activity: pelvic rest Diet: routine Medications: PNV, Ibuprofen and Vicodin Condition: stable Instructions: refer to practice specific booklet Discharge to: home Follow-up Information    Follow up with [facility]. (Make postpartum appointment for 4-6 weeks)    Contact information:   [address] [place] [address]          Newborn Data: Live born female  Birth Weight: 5 lb 11.2 oz (2586 g) APGAR: 9, 9  Home with mother. Plans to breastfeed, but infant currently not latching so pt pumping and using bottle.  Cam Hai 12/27/2012, 7:15 AM

## 2012-12-29 NOTE — Discharge Summary (Signed)
Attestation of Attending Supervision of Advanced Practitioner (CNM/NP): Evaluation and management procedures were performed by the Advanced Practitioner under my supervision and collaboration.  I have reviewed the Advanced Practitioner's note and chart, and I agree with the management and plan.  Mylan Lengyel 12/29/2012 8:53 AM

## 2012-12-29 NOTE — H&P (Signed)
Attestation of Attending Supervision of Advanced Practitioner: Evaluation and management procedures were performed by the PA/NP/CNM/OB Fellow under my supervision/collaboration. Chart reviewed and agree with management and plan. Will proceed to OR for Repeat Cesarean section.  Tilda Burrow 12/29/2012 6:23 PM

## 2012-12-31 ENCOUNTER — Encounter: Payer: Self-pay | Admitting: Medical

## 2013-01-07 ENCOUNTER — Encounter (HOSPITAL_COMMUNITY): Admission: RE | Payer: Self-pay | Source: Ambulatory Visit

## 2013-01-07 ENCOUNTER — Inpatient Hospital Stay (HOSPITAL_COMMUNITY): Admission: RE | Admit: 2013-01-07 | Payer: Self-pay | Source: Ambulatory Visit | Admitting: Obstetrics & Gynecology

## 2013-01-07 SURGERY — Surgical Case
Anesthesia: Regional | Site: Abdomen

## 2013-01-23 ENCOUNTER — Encounter: Payer: Self-pay | Admitting: Medical

## 2013-01-23 ENCOUNTER — Ambulatory Visit (INDEPENDENT_AMBULATORY_CARE_PROVIDER_SITE_OTHER): Payer: Self-pay | Admitting: Medical

## 2013-01-23 VITALS — BP 118/79 | HR 78 | Temp 97.2°F | Ht 62.0 in | Wt 175.7 lb

## 2013-01-23 DIAGNOSIS — O34219 Maternal care for unspecified type scar from previous cesarean delivery: Secondary | ICD-10-CM

## 2013-01-23 NOTE — Progress Notes (Signed)
Patient ID: Kimberly Pope, female   DOB: Dec 02, 1985, 26 y.o.   MRN: [id number] Subjective:     Kimberly Pope is a 27 y.o. female who presents for a postpartum visit. She is 4 weeks postpartum following a low cervical transverse Cesarean section. I have fully reviewed the prenatal and intrapartum course. The delivery was at 37.1 gestational weeks. Outcome: primary cesarean section, low transverse incision. Anesthesia: spinal. Postpartum course has been normal. Baby's course has been normal. Baby is feeding by bottle - unknown. Bleeding staining only. Bowel function is normal. Bladder function is normal. Patient is not sexually active. Contraception method is tubal ligation. Postpartum depression screening: negative.  The following portions of the patient's history were reviewed and updated as appropriate: allergies, current medications, past family history, past medical history, past social history, past surgical history and problem list.  Review of Systems Pertinent items are noted in HPI.   Objective:    BP 118/79  Pulse 78  Temp(Src) 97.2 F (36.2 C) (Oral)  Ht 5\' 2"  (1.575 m)  Wt 175 lb 11.2 oz (79.697 kg)  BMI 32.13 kg/m2  Breastfeeding? No  General:  alert and cooperative   Breasts:  not performed  Lungs: clear to auscultation bilaterally  Heart:  regular rate and rhythm, S1, S2 normal, no murmur, click, rub or gallop  Abdomen:  soft, mild tenderness to palpation just below the umbilicus, no masses or organomegaly. Normal bowel sounds   Vulva:  normal  Vagina: normal vagina  Cervix:  normal  Corpus: normal  Adnexa:  normal adnexa and no mass, fullness, tenderness  Rectal Exam: Not performed.        Assessment:     Normal postpartum exam. Pap smear not done at today's visit.  Patient had pap during pregnancy.   Plan:    1. Contraception: tubal ligation 2. Follow up in: 1 year for annual exam or as needed.

## 2013-01-23 NOTE — Patient Instructions (Addendum)
Cesarean Delivery  Cesarean delivery is the birth of a baby through a cut (incision) in the abdomen and womb (uterus).  LET YOUR CAREGIVER KNOW ABOUT:  Complicationsinvolving the pregnancy.  Allergies.  Medicines taken including herbs, eyedrops, over-the-counter medicines, and creams.  Use of steroids (by mouth or creams).  Previous problems with anesthetics or numbing medicine.  Previous surgery.  History of blood clots.  History of bleeding or blood problems.  Other health problems. RISKS AND COMPLICATIONS   Bleeding.  Infection.  Blood clots.  Injury to surrounding organs.  Anesthesia problems.  Injury to the baby. BEFORE THE PROCEDURE   A tube (Foley catheter) will be placed in your bladder. The Foley catheter drains the urine from your bladder into a bag. This keeps your bladder empty during surgery.  An intravenous access tube (IV) will be placed in your arm.  Hair may be removed from your pubic area and your lower abdomen. This is to prevent infection in the incision site.  You may be given an antacid medicine to drink. This will prevent acid contents in your stomach from going into your lungs if you vomit during the surgery.  You may be given an antibiotic medicine to prevent infection. PROCEDURE   You may be given medicine to numb the lower half of your body (regional anesthetic). If you were in labor, you may have already had an epidural in place which can be used in both labor and cesarean delivery. You may possibly be given medicine to make you sleep (general anesthetic) though this is not as common.  An incision will be made in your abdomen that extends to your uterus. There are 2 basic kinds of incisions:  The horizontal (transverse) incision. Horizontal incisions are used for most routine cesarean deliveries.  The vertical (up and down) incision. This is less commonly used. This is most often reserved for women who have a serious complication  (extreme prematurity) or under emergency situations.  The horizontal and vertical incisions may both be used at the same time. However, this is very uncommon.  Your baby will then be delivered. AFTER THE PROCEDURE   If you were awake during the surgery, you will see your baby right away. If you were asleep, you will see your baby as soon as you are awake.  You may breastfeed your baby after surgery.  You may be able to get up and walk the same day as the surgery. If you need to stay in bed for a period of time, you will receive help to turn, cough, and take deep breaths after surgery. This helps prevent lung problems such as pneumonia.  Do not get out of bed alone the first time after surgery. You will need help getting out of bed until you are able to do this by yourself.  You may be able to shower the day after your cesarean delivery. After the bandage (dressing) is taken off the incision site, a nurse will assist you to shower, if you like.  You will have pneumatic compressing hose placed on your feet or lower legs. These hose are used to prevent blood clots. When you are up and walking regularly, they will no longer be necessary.  Do not cross your legs when you sit.  Save any blood clots that you pass. If you pass a clot while on the toilet, do not flush it. Call for the nurse. Tell the nurse if you think you are bleeding too much or passing too many   clots.  Start drinking liquids and eating food as directed by your caregiver. If your stomach is not ready, drinking and eating too soon can cause an increase in bloating and swelling of your intestine and abdomen. This is very uncomfortable.  You will be given medicine as needed. Let your caregivers know if you are hurting. They want you to be comfortable. You may also be given an antibiotic to prevent an infection.  Your IV will be taken out when you are drinking a reasonable amount of fluids. The Foley catheter is taken out when  you are up and walking.  If your blood type is Rh negative and your baby's blood type is Rh positive, you will be given a shot of anti-D immune globulin. This shot prevents you from having Rh problems with a future pregnancy. You should get the shot even if you had your tubes tied (tubal ligation).  If you are allowed to take the baby for a walk, place the baby in the bassinet and push it. Do not carry your baby in your arms. Document Released: 11/12/2005 Document Revised: 02/04/2012 Document Reviewed: 03/09/2011 [employer] Patient Information 2013 [employer].  

## 2013-03-06 NOTE — OR Nursing (Signed)
Addendum: Wound Classification for Cesarean Section: Clean-Contaminated 

## 2013-05-08 ENCOUNTER — Encounter: Payer: Self-pay | Admitting: *Deleted

## 2014-04-16 IMAGING — US US OB FOLLOW-UP
1 series · 12 of 28 positions shown · non-contrast
Comparison: none

OBSTETRICS REPORT
                      (Signed Final 12/04/2012 [DATE])

             [facility]
Service(s) Provided
 US OB FOLLOW UP                                       76816.1
Indications
 Previous cesarean section
 History of genetic / anatomic abnormality -
 personal or family
 Fetal abnormality - other known or suspected (i.e.
 choriod plexus cyst, EIF, renal pyelectasis)
Fetal Evaluation
 Num Of Fetuses:    1
 Fetal Heart Rate:  175                          bpm
 Cardiac Activity:  Observed
 Presentation:      Cephalic
 Placenta:          Posterior, above cervical
                    os
 P. Cord            Previously Visualized
 Insertion:
 Amniotic Fluid
 AFI FV:      Subjectively within normal limits
 AFI Sum:     9.77    cm       18  %Tile     Larg Pckt:    4.36  cm
 RUQ:   3.08    cm   RLQ:    0      cm    LUQ:   2.33    cm   LLQ:    4.36   cm
Biometry
 BPD:     84.2  mm     G. Age:  33w 6d                CI:         81.0   70 - 86
 OFD:    103.9  mm                                    FL/HC:      21.0   19.4 -
 HC:     300.7  mm     G. Age:  33w 3d        7  %    HC/AC:      1.01   0.96 -
 AC:     296.3  mm     G. Age:  33w 4d       39  %    FL/BPD:     75.1   71 - 87
 FL:      63.2  mm     G. Age:  32w 5d       10  %    FL/AC:      21.3   20 - 24
 Est. FW:    4776  gm    4 lb 13 oz      44  %
Gestational Age
 LMP:           34w 1d        Date:  04/09/12                 EDD:   01/14/13
 U/S Today:     33w 3d                                        EDD:   01/19/13
 Best:          34w 1d     Det. By:  LMP  (04/09/12)          EDD:   01/14/13
Anatomy
 Cranium:          Appears normal         Aortic Arch:      Previously seen
 Fetal Cavum:      Appears normal         Ductal Arch:      Not well visualized
 Ventricles:       Appears normal         Diaphragm:        Previously seen
 Choroid Plexus:   Previously seen        Stomach:          Appears normal, left
                                                            sided
 Cerebellum:       Previously seen        Abdomen:          Appears normal
 Posterior Fossa:  Previously seen        Abdominal Wall:   Previously seen
 Nuchal Fold:      Not applicable (>20    Cord Vessels:     Previously seen
                   wks GA)
 Face:             Orbits appear          Kidneys:          Appear normal
                   normal
 Lips:             Previously seen        Bladder:          Appears normal
 Heart:            Appears normal         Spine:            Previously seen
                   (4CH, axis, and
                   situs)
 RVOT:             Previously seen        Lower             Previously seen
                                          Extremities:
 LVOT:             Previously seen        Upper             Previously seen
 Other:  Fetus appears to be a female. Heels previously seen.
Cervix Uterus Adnexa
 Cervix:       Not visualized (advanced GA >34 wks)
 Left Ovary:    Not visualized.
 Right Ovary:   Within normal limits.
 Adnexa:     No abnormality visualized.
Impression
INDICATION: 27 yr old [id number] at 73w5d for fetal ultrasound
 secondary to previous finding of dacrocystocele in fetus.

[Series 1: us ob follow-up · 0.23mm/px · 12 of 34 slices shown]
[im 2/34]
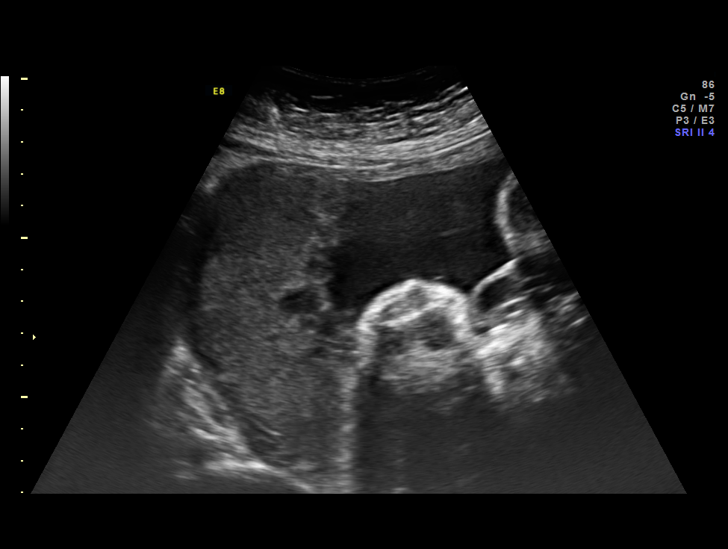
[im 4/34]
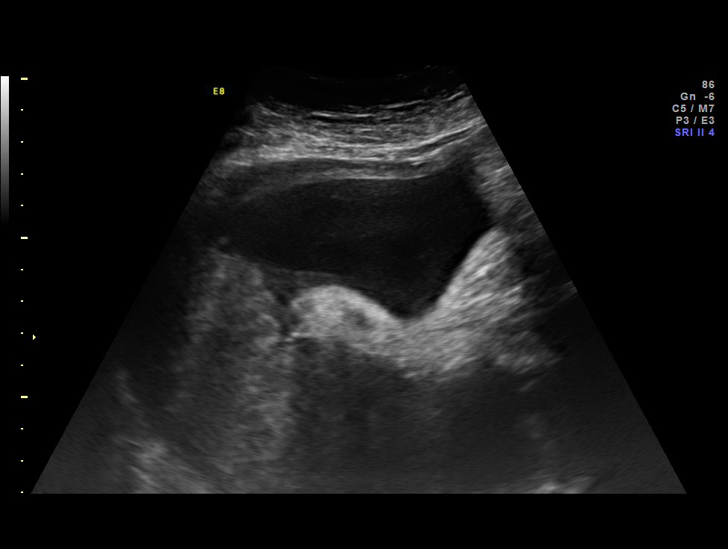
[im 7/34]
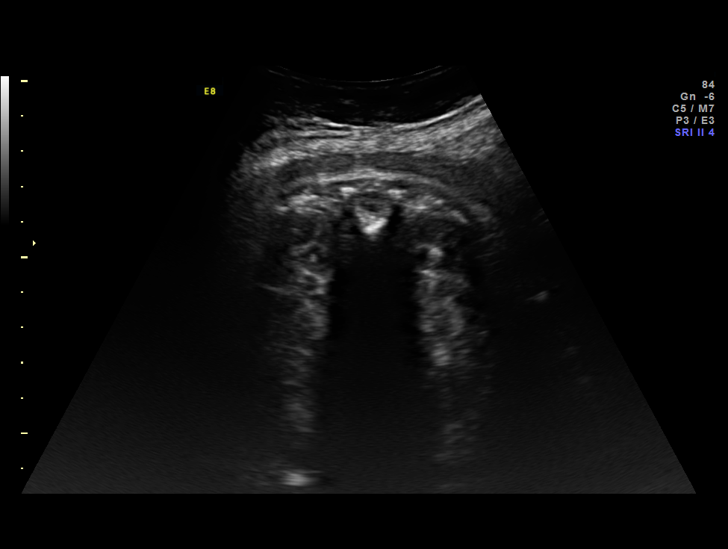
[im 10/34]
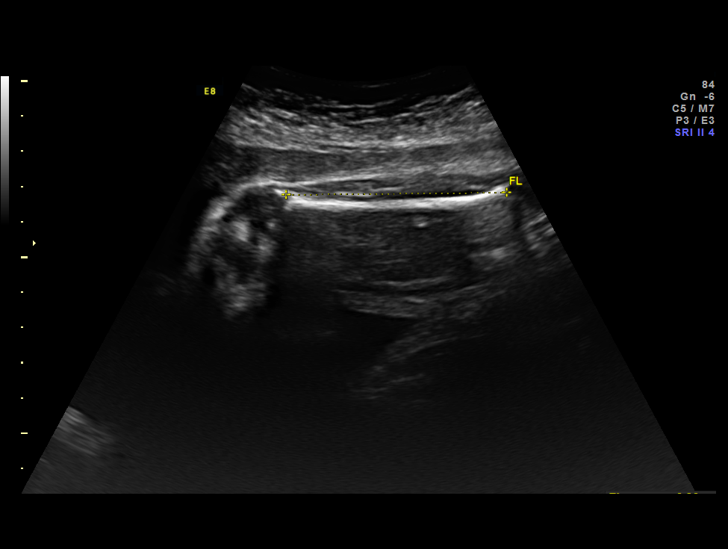
[im 13/34]
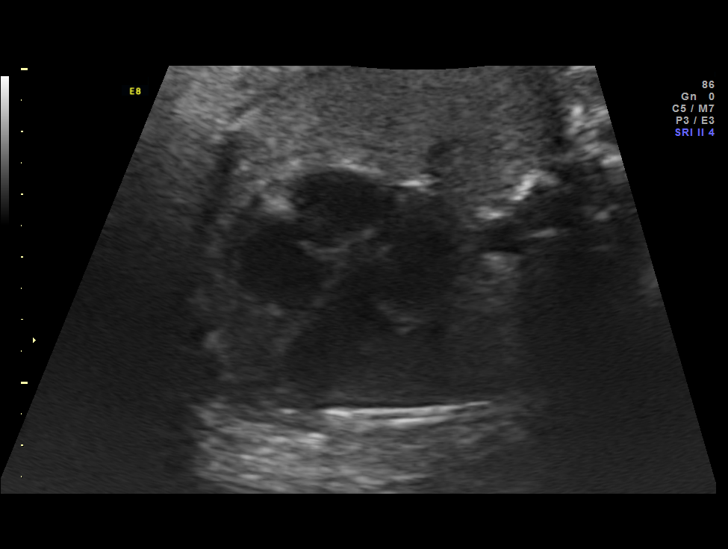
[im 15/34]
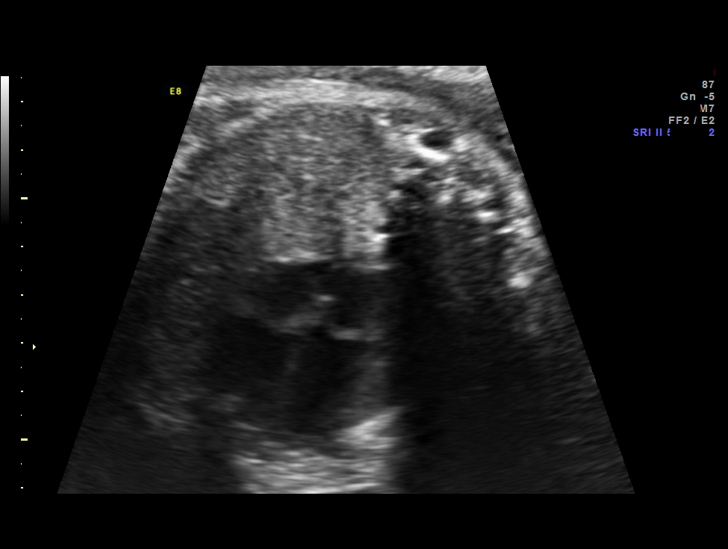
[im 19/34]
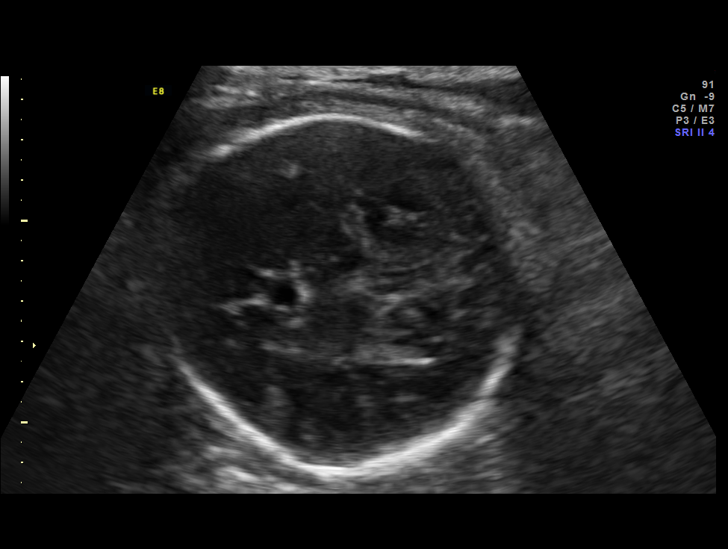
[im 21/34]
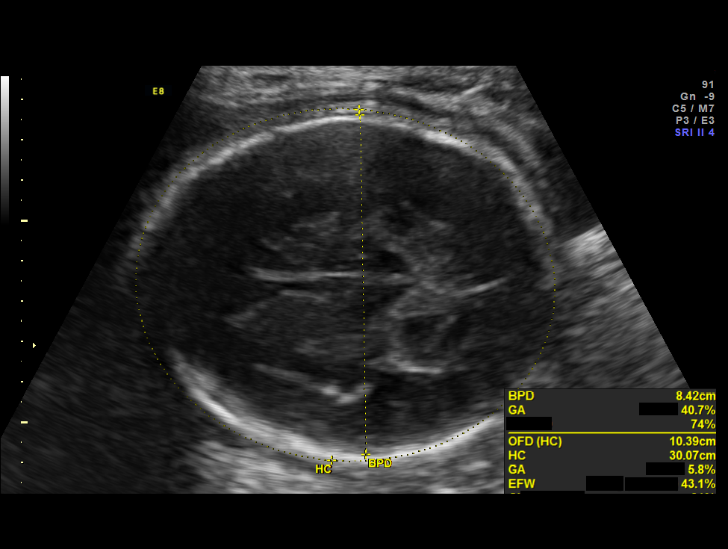
[im 24/34]
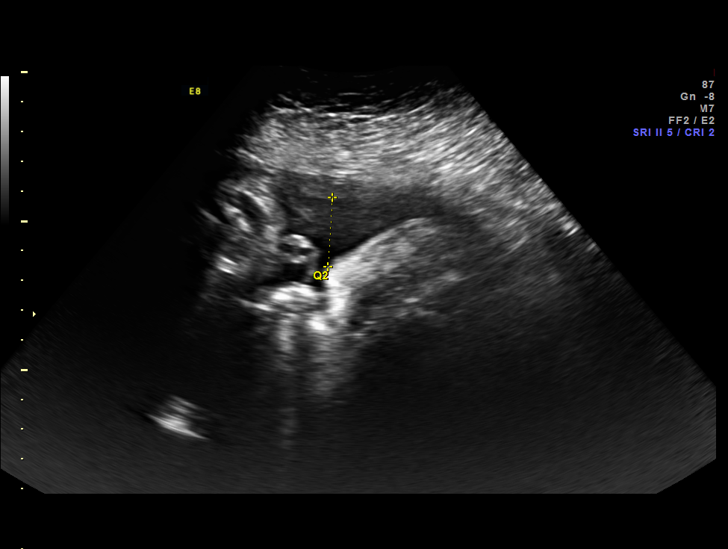
[im 27/34]
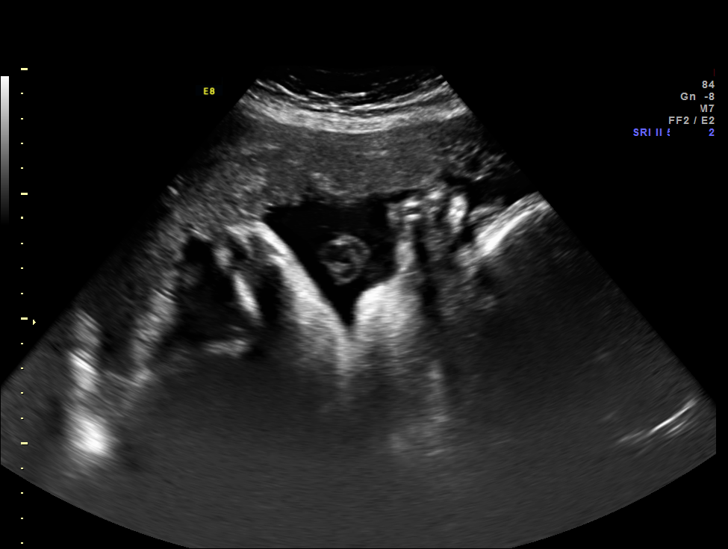
[im 30/34]
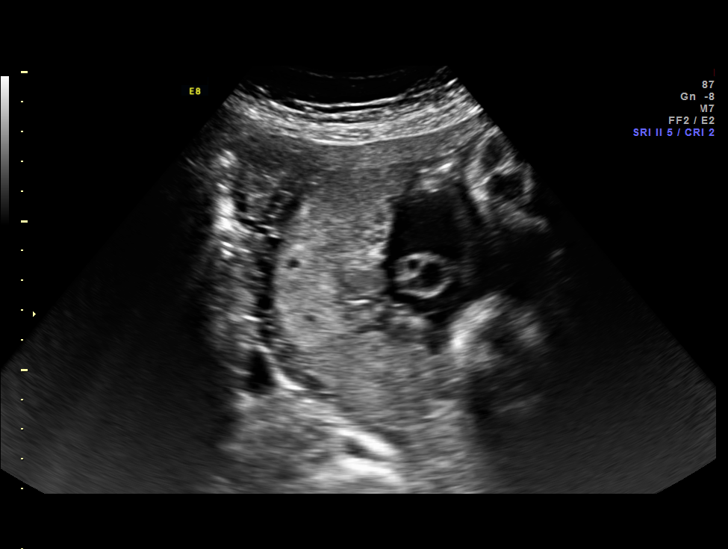
[im 32/34]
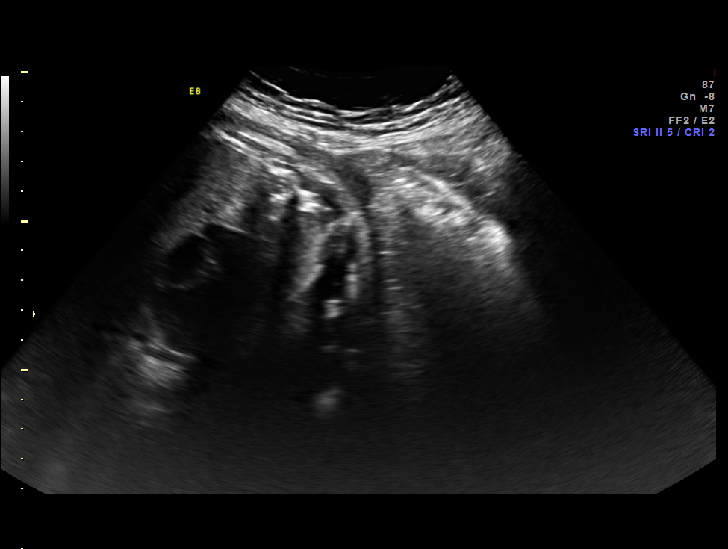

[12 of 28 positions shown; findings below may reference images not displayed]

FINDINGS: 1. Single intrauterine pregnancy.
 2. Estimated fetal weight is in the 44th%.
 3. Posterior placenta without evidence of previa.
 4. Normal amniotic fluid index.
 5. The limited anatomy survey is normal.
 6. The dacrocystocele is not visualized and appears to have
 resolved.
Recommendations

 1. Appropriate fetal growth.
 2. Previous finding of dacrocystocele:
 - previously counseled
 - appears to have resolved
 - recommend inform Pediatrician at time of delivery
 3. In review of the chart there was mention of a "heart
 problem" in a previous child
 - recommend if this was a congenital heart defect that the
 Pediatricians be informed and consider a neonatal
 echocardiogram
 - this fetus had a normal anatomy survey; but no
 echocardiogram was done
 4. Recommend follow up as clinically indicated
 ANIKA with us.  Please do not hesitate to

## 2014-05-02 IMAGING — CR DG CHEST 2V
2 series · 2 of 2 positions shown · non-contrast
Comparison: None.

CLINICAL DATA: Left posterior pain for past week.

CHEST - 2 VIEW

[w chest pa]
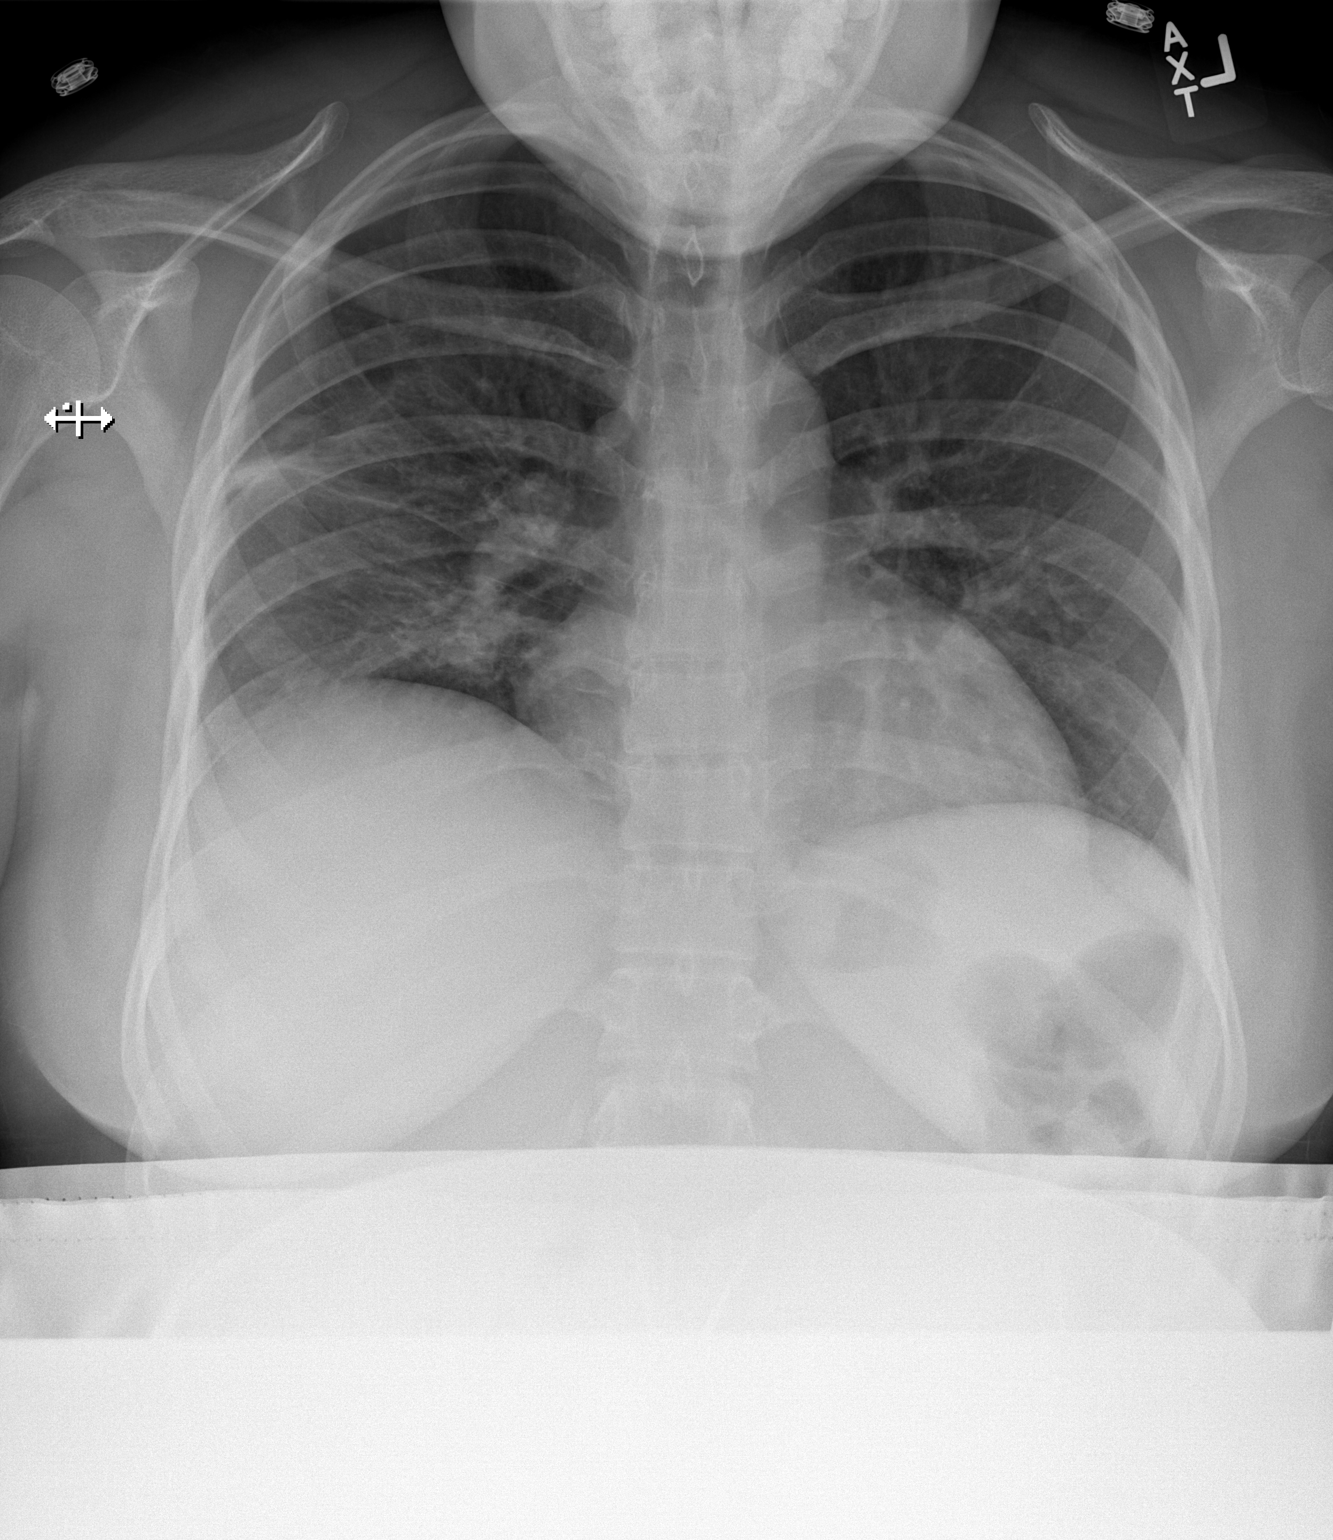

[w chest lat]
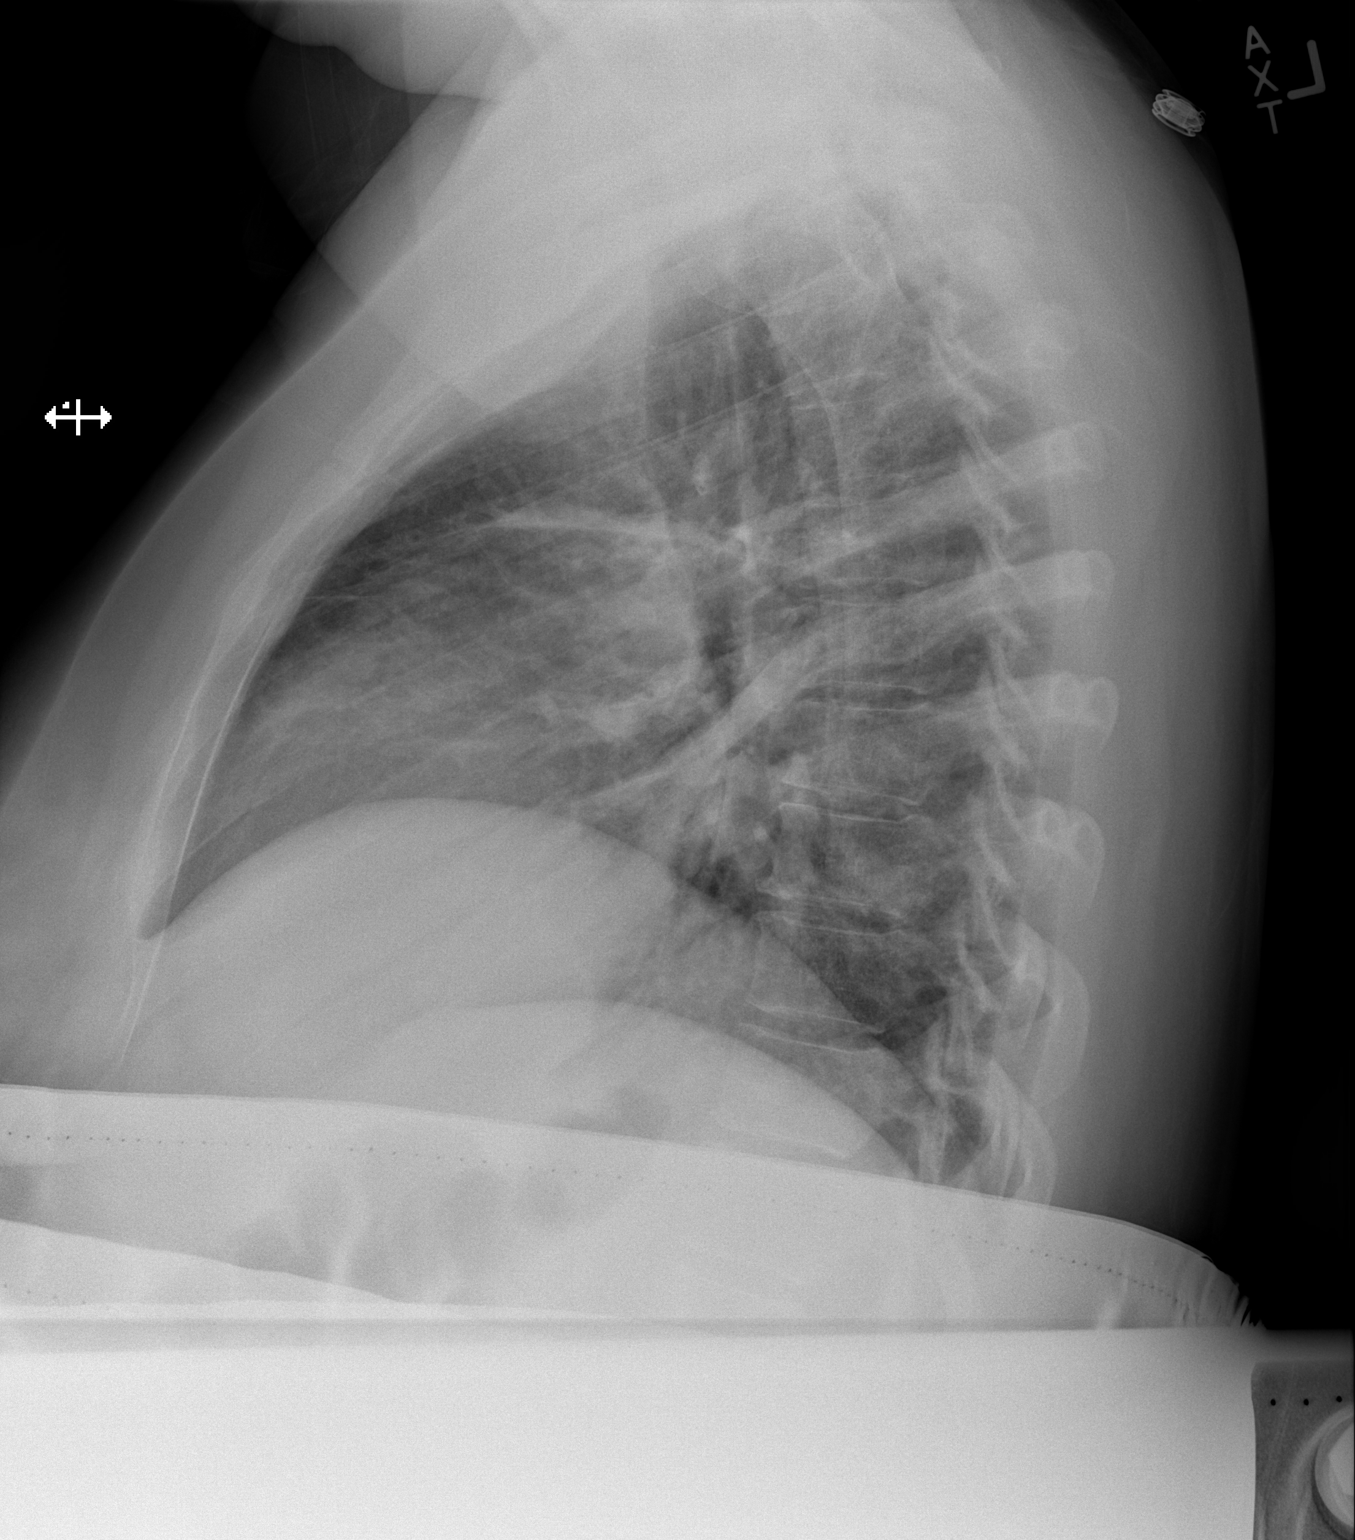

[2 of 2 positions shown; findings below may reference images not displayed]

FINDINGS: Elevated right hemidiaphragm.

Pulmonary vascular congestion.

Appearance of fluid within the right minor and major fissure.
Findings may represent changes of congestive heart failure.
Recommend follow-up until clearance.

No pneumothorax.

Heart size top normal.

No obvious fracture.

Possibility of pulmonary embolus not adequately assessed by plain
film examination.
IMPRESSION: Elevated right hemidiaphragm.

Pulmonary vascular congestion.  Appearance of fluid within the
right minor and major fissure.  Findings may represent changes of
congestive heart failure.  Recommend follow-up until clearance.

No pneumothorax.

Heart size top normal.

## 2014-09-27 ENCOUNTER — Encounter: Payer: Self-pay | Admitting: Medical

## 2019-12-18 ENCOUNTER — Other Ambulatory Visit: Payer: Self-pay | Admitting: Obstetrics and Gynecology

## 2019-12-18 DIAGNOSIS — N6320 Unspecified lump in the left breast, unspecified quadrant: Secondary | ICD-10-CM

## 2019-12-31 ENCOUNTER — Ambulatory Visit
Admission: RE | Admit: 2019-12-31 | Discharge: 2019-12-31 | Disposition: A | Discharge status: Home / Self Care | Payer: No Typology Code available for payment source | Source: Ambulatory Visit | Attending: Obstetrics and Gynecology | Admitting: Obstetrics and Gynecology

## 2019-12-31 ENCOUNTER — Other Ambulatory Visit: Payer: Self-pay

## 2019-12-31 DIAGNOSIS — N6320 Unspecified lump in the left breast, unspecified quadrant: Secondary | ICD-10-CM

## 2020-01-03 ENCOUNTER — Ambulatory Visit
Admission: EM | Admit: 2020-01-03 | Discharge: 2020-01-03 | Disposition: A | Discharge status: Home / Self Care | Payer: No Typology Code available for payment source | Attending: Physician Assistant | Admitting: Physician Assistant

## 2020-01-03 ENCOUNTER — Other Ambulatory Visit: Payer: Self-pay

## 2020-01-03 ENCOUNTER — Encounter: Payer: Self-pay | Admitting: Emergency Medicine

## 2020-01-03 DIAGNOSIS — L232 Allergic contact dermatitis due to cosmetics: Secondary | ICD-10-CM

## 2020-01-03 MED ORDER — PREDNISONE 50 MG PO TABS: 50.0000 mg | ORAL_TABLET | Freq: Every day | ORAL | 0 refills | Status: AC

## 2020-01-03 MED ORDER — CETIRIZINE HCL 10 MG PO TABS: 10.0000 mg | ORAL_TABLET | Freq: Every day | ORAL | 0 refills | Status: AC

## 2020-01-03 NOTE — ED Triage Notes (Signed)
Pt here for facial swelling x 3 days after using new facial cream

## 2020-01-03 NOTE — ED Provider Notes (Signed)
[facility]    CSN: [id number] Arrival date & time: 01/03/20  1152      History   Chief Complaint Chief Complaint  Patient presents with  . Facial Swelling    HPI Kimberly Pope is a 34 y.o. female.   34 year old female comes in for facial swelling, itching after using new facial cream 3 days ago.  States noticed symptoms few hours after applying facial cream.  Has since then discontinued new product, but feels that symptoms has been worsening.  States face is diffusely pruritic, with increased facial swelling.  Denies pain.  Denies swelling to the lips, tongue, throat.  Denies trouble swallowing, trouble breathing, tripoding, drooling, trismus.  Has not tried anything for the symptoms.     Past Medical History:  Diagnosis Date  . History of cesarean delivery 08/19/2005  . No pertinent past medical history   . Supervision of normal subsequent pregnancy 10/15/2012    Patient Active Problem List   Diagnosis Date Noted  . 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 Supervision of normal subsequent pregnancy 10/15/2012  . Previous cesarean delivery, antepartum condition or complication 10/15/2012    Past Surgical History:  Procedure Laterality Date  . CESAREAN SECTION    . CESAREAN SECTION  12/25/2012   Procedure: CESAREAN SECTION;  Surgeon: Tilda Burrow, MD;  Location: [facility];  Service: Obstetrics;  Laterality: N/A;    OB History    Gravida  3   Para  3   Term  3   Preterm      AB      Living  4     SAB      TAB      Ectopic      Multiple  1   Live Births  4            Home Medications    Prior to Admission medications   Medication Sig Start Date End Date Taking? Authorizing Provider  cetirizine (ZYRTEC ALLERGY) 10 MG tablet Take 1 tablet (10 mg total) by mouth daily. 01/03/20   Belinda Fisher, PA-C  predniSONE (DELTASONE) 50 MG tablet Take 1 tablet (50 mg total) by mouth daily with breakfast. 01/03/20   Belinda Fisher, PA-C    Family  History Family History  Problem Relation Age of Onset  . Depression Mother   . Diabetes Mother   . Hypertension Mother   . Heart murmur Daughter     Social History Social History   Tobacco Use  . Smoking status: Never Smoker  . Smokeless tobacco: Never Used  Substance Use Topics  . Alcohol use: No  . Drug use: No     Allergies   Patient has no known allergies.   Review of Systems Review of Systems  Reason unable to perform ROS: See HPI as above.     Physical Exam Triage Vital Signs ED Triage Vitals [01/03/20 1205]  Enc Vitals Group     BP (!) 146/85     Pulse Rate 70     Resp 18     Temp 98.2 F (36.8 C)     Temp Source Oral     SpO2 99 %     Weight      Height      Head Circumference      Peak Flow      Pain Score 3     Pain Loc      Pain Edu?      Excl. in GC?  No data found.  Updated Vital Signs BP (!) 146/85 (BP Location: Right Arm)   Pulse 70   Temp 98.2 F (36.8 C) (Oral)   Resp 18   LMP 12/23/2019   SpO2 99%   Visual Acuity Right Eye Distance:   Left Eye Distance:   Bilateral Distance:    Right Eye Near:   Left Eye Near:    Bilateral Near:     Physical Exam Constitutional:      General: She is not in acute distress.    Appearance: Normal appearance. She is well-developed. She is not toxic-appearing or diaphoretic.  HENT:     Head: Normocephalic and atraumatic.     Mouth/Throat:     Mouth: Mucous membranes are moist.     Pharynx: Oropharynx is clear. Uvula midline.     Comments: Patient handling own secretions well.  Eyes:     Conjunctiva/sclera: Conjunctivae normal.     Pupils: Pupils are equal, round, and reactive to light.  Pulmonary:     Effort: Pulmonary effort is normal. No respiratory distress.     Comments: Speaking in full sentences without difficulty Musculoskeletal:     Cervical back: Normal range of motion and neck supple.  Skin:    General: Skin is warm and dry.     Comments: Diffusely erythematous  forehead, bilateral cheeks. Swelling noticeable to bilateral cheeks. No pain on palpation. No warmth.   No swelling to the lips.   Neurological:     Mental Status: She is alert and oriented to person, place, and time.      UC Treatments / Results  Labs (all labs ordered are listed, but only abnormal results are displayed) Labs Reviewed - No data to display  EKG   Radiology No results found.  Procedures Procedures (including critical care time)  Medications Ordered in UC Medications - No data to display  Initial Impression / Assessment and Plan / UC Course  I have reviewed the triage vital signs and the nursing notes.  Pertinent labs & imaging results that were available during my care of the patient were reviewed by me and considered in my medical decision making (see chart for details).    Prednisone, antihistamine. Ice compress. Return precautions given. Patient expresses understanding and agrees to plan.  Final Clinical Impressions(s) / UC Diagnoses   Final diagnoses:  Allergic contact dermatitis due to cosmetics   ED Prescriptions    Medication Sig Dispense Auth. Provider   predniSONE (DELTASONE) 50 MG tablet Take 1 tablet (50 mg total) by mouth daily with breakfast. 5 tablet Brookelin Felber V, PA-C   cetirizine (ZYRTEC ALLERGY) 10 MG tablet Take 1 tablet (10 mg total) by mouth daily. 15 tablet Ok Edwards, PA-C     PDMP not reviewed this encounter.   Ok Edwards, PA-C 01/03/20 1234

## 2020-01-03 NOTE — Discharge Instructions (Signed)
Start prednisone as directed. Zyrtec as directed. Ice compress to the face. Avoid new hygiene product. If swelling to the throat, trouble breathing, drooling, trouble swallowing, go to the emergency department for further evaluation needed.
